# Patient Record
Sex: Male | Born: 1965 | Race: White | Hispanic: No | Marital: Married | Smoking: Never smoker
Health system: Southern US, Community
[De-identification: ages and names within clinical notes are randomized; demographics above are authoritative.]

## PROBLEM LIST (undated history)

## (undated) DIAGNOSIS — E291 Testicular hypofunction: Secondary | ICD-10-CM

## (undated) DIAGNOSIS — N181 Chronic kidney disease, stage 1: Secondary | ICD-10-CM

## (undated) DIAGNOSIS — G4730 Sleep apnea, unspecified: Secondary | ICD-10-CM

## (undated) DIAGNOSIS — N529 Male erectile dysfunction, unspecified: Secondary | ICD-10-CM

## (undated) DIAGNOSIS — T7840XA Allergy, unspecified, initial encounter: Secondary | ICD-10-CM

## (undated) DIAGNOSIS — D75839 Thrombocytosis, unspecified: Secondary | ICD-10-CM

## (undated) DIAGNOSIS — I129 Hypertensive chronic kidney disease with stage 1 through stage 4 chronic kidney disease, or unspecified chronic kidney disease: Secondary | ICD-10-CM

## (undated) DIAGNOSIS — E669 Obesity, unspecified: Secondary | ICD-10-CM

## (undated) DIAGNOSIS — I10 Essential (primary) hypertension: Secondary | ICD-10-CM

## (undated) DIAGNOSIS — D473 Essential (hemorrhagic) thrombocythemia: Secondary | ICD-10-CM

## (undated) DIAGNOSIS — E785 Hyperlipidemia, unspecified: Secondary | ICD-10-CM

## (undated) HISTORY — DX: Thrombocytosis, unspecified: D75.839

## (undated) HISTORY — DX: Hyperlipidemia, unspecified: E78.5

## (undated) HISTORY — DX: Hypertensive chronic kidney disease with stage 1 through stage 4 chronic kidney disease, or unspecified chronic kidney disease: I12.9

## (undated) HISTORY — DX: Essential (primary) hypertension: I10

## (undated) HISTORY — DX: Essential (hemorrhagic) thrombocythemia: D47.3

## (undated) HISTORY — DX: Male erectile dysfunction, unspecified: N52.9

## (undated) HISTORY — DX: Chronic kidney disease, stage 1: N18.1

## (undated) HISTORY — DX: Obesity, unspecified: E66.9

## (undated) HISTORY — DX: Allergy, unspecified, initial encounter: T78.40XA

## (undated) HISTORY — DX: Sleep apnea, unspecified: G47.30

## (undated) HISTORY — DX: Testicular hypofunction: E29.1

---

## 1988-02-21 HISTORY — PX: LUMBAR DISC SURGERY: SHX700

## 2005-02-09 ENCOUNTER — Emergency Department: Payer: Self-pay | Admitting: Emergency Medicine

## 2008-02-25 ENCOUNTER — Ambulatory Visit: Payer: Self-pay | Admitting: Family Medicine

## 2008-02-25 DIAGNOSIS — J309 Allergic rhinitis, unspecified: Secondary | ICD-10-CM | POA: Insufficient documentation

## 2008-02-25 DIAGNOSIS — I10 Essential (primary) hypertension: Secondary | ICD-10-CM | POA: Insufficient documentation

## 2008-04-28 ENCOUNTER — Ambulatory Visit: Payer: Self-pay | Admitting: Family Medicine

## 2008-04-28 DIAGNOSIS — E785 Hyperlipidemia, unspecified: Secondary | ICD-10-CM | POA: Insufficient documentation

## 2008-04-28 DIAGNOSIS — F529 Unspecified sexual dysfunction not due to a substance or known physiological condition: Secondary | ICD-10-CM | POA: Insufficient documentation

## 2008-05-06 ENCOUNTER — Ambulatory Visit: Payer: Self-pay | Admitting: Family Medicine

## 2008-05-08 LAB — CONVERTED CEMR LAB
Albumin: 4.2 g/dL (ref 3.5–5.2)
Alkaline Phosphatase: 77 units/L (ref 39–117)
Calcium: 9.4 mg/dL (ref 8.4–10.5)
Eosinophils Relative: 5.7 % — ABNORMAL HIGH (ref 0.0–5.0)
GFR calc non Af Amer: 70.65 mL/min (ref >60)
Glucose, Bld: 98 mg/dL (ref 70–99)
HCT: 45.5 % (ref 39.0–52.0)
HDL: 45.2 mg/dL (ref >39.00)
Hemoglobin: 15.7 g/dL (ref 13.0–17.0)
Lymphs Abs: 2.6 10*3/uL (ref 0.7–4.0)
MCV: 92 fL (ref 78.0–100.0)
Monocytes Absolute: 0.5 10*3/uL (ref 0.1–1.0)
Monocytes Relative: 8 % (ref 3.0–12.0)
Neutro Abs: 2.3 10*3/uL (ref 1.4–7.7)
Potassium: 4.3 meq/L (ref 3.5–5.1)
Sodium: 139 meq/L (ref 135–145)
Total Bilirubin: 0.8 mg/dL (ref 0.3–1.2)
Triglycerides: 68 mg/dL (ref 0.0–149.0)
VLDL: 13.6 mg/dL (ref 0.0–40.0)
WBC: 5.7 10*3/uL (ref 4.5–10.5)

## 2009-06-02 ENCOUNTER — Ambulatory Visit: Payer: Self-pay | Admitting: Family Medicine

## 2009-06-02 DIAGNOSIS — R5383 Other fatigue: Secondary | ICD-10-CM

## 2009-06-02 DIAGNOSIS — R5381 Other malaise: Secondary | ICD-10-CM | POA: Insufficient documentation

## 2009-06-03 LAB — CONVERTED CEMR LAB
ALT: 31 units/L (ref 0–53)
Albumin: 4.1 g/dL (ref 3.5–5.2)
BUN: 14 mg/dL (ref 6–23)
Basophils Absolute: 0 10*3/uL (ref 0.0–0.1)
CO2: 30 meq/L (ref 19–32)
Chloride: 104 meq/L (ref 96–112)
Cholesterol: 206 mg/dL — ABNORMAL HIGH (ref 0–200)
Creatinine, Ser: 1 mg/dL (ref 0.4–1.5)
Glucose, Bld: 96 mg/dL (ref 70–99)
HCT: 44 % (ref 39.0–52.0)
Hemoglobin: 15.2 g/dL (ref 13.0–17.0)
Lymphs Abs: 3.1 10*3/uL (ref 0.7–4.0)
MCHC: 34.6 g/dL (ref 30.0–36.0)
MCV: 91.9 fL (ref 78.0–100.0)
Monocytes Relative: 7.4 % (ref 3.0–12.0)
Neutro Abs: 2.6 10*3/uL (ref 1.4–7.7)
RDW: 13.6 % (ref 11.5–14.6)
TSH: 1.87 microintl units/mL (ref 0.35–5.50)
Total Protein: 7.9 g/dL (ref 6.0–8.3)
Triglycerides: 78 mg/dL (ref 0.0–149.0)

## 2009-06-07 ENCOUNTER — Ambulatory Visit: Payer: Self-pay | Admitting: Family Medicine

## 2010-03-22 NOTE — Letter (Signed)
Summary: Records Dated 10-30-03 thru 12-03-07/[facility] MD  Records Dated 10-30-03 thru 12-03-07/[facility] MD   Imported By: Lanelle Bal 11/12/2009 13:02:28  _____________________________________________________________________  External Attachment:    Type:   Image     Comment:   External Document

## 2010-03-22 NOTE — Assessment & Plan Note (Signed)
Summary: CPX/CLE   Vital Signs:  Patient profile:   45 year old male Height:      69 inches Weight:      223.2 pounds BMI:     33.08 Temp:     97.5 degrees F oral Pulse rate:   84 / minute Pulse rhythm:   regular BP sitting:   110 / 80  (left arm) Cuff size:   large  Vitals Entered By: Benny Lennert CMA Duncan Dull) (June 07, 2009 8:23 AM)  History of Present Illness: Chief complaint cpx  Preventive Screening-Counseling & Management  Alcohol-Tobacco     Alcohol Counseling: not indicated; use of alcohol is not excessive or problematic     Smoking Status: never     Tobacco Counseling: not indicated; no tobacco use  Caffeine-Diet-Exercise     Does Patient Exercise: yes     Type of exercise: running     Exercise (avg: min/session): 30-60     Times/week: 5     Exercise Counseling: not indicated; exercise is adequate  Hep-HIV-STD-Contraception     STD Risk: no risk noted     Testicular SE Education/Counseling to perform regular STE      Sexual History:  currently monogamous.        Drug Use:  never.    Clinical Review Panels:  Prevention   Last PSA:  0.54 (06/02/2009)  Lipid Management   Cholesterol:  206 (06/02/2009)   HDL (good cholesterol):  49.50 (06/02/2009)  CBC   WBC:  6.4 (06/02/2009)   RBC:  4.78 (06/02/2009)   Hgb:  15.2 (06/02/2009)   Hct:  44.0 (06/02/2009)   Platelets:  365.0 (06/02/2009)   MCV  91.9 (06/02/2009)   MCHC  34.6 (06/02/2009)   RDW  13.6 (06/02/2009)   PMN:  40.2 (06/02/2009)   Lymphs:  48.3 (06/02/2009)   Monos:  7.4 (06/02/2009)   Eosinophils:  3.6 (06/02/2009)   Basophil:  0.5 (06/02/2009)  Complete Metabolic Panel   Glucose:  96 (06/02/2009)   Sodium:  141 (06/02/2009)   Potassium:  4.3 (06/02/2009)   Chloride:  104 (06/02/2009)   CO2:  30 (06/02/2009)   BUN:  14 (06/02/2009)   Creatinine:  1.0 (06/02/2009)   Albumin:  4.1 (06/02/2009)   Total Protein:  7.9 (06/02/2009)   Calcium:  9.2 (06/02/2009)   Total Bili:  0.5  (06/02/2009)   Alk Phos:  76 (06/02/2009)   SGPT (ALT):  31 (06/02/2009)   SGOT (AST):  29 (06/02/2009)   Allergies (verified): No Known Drug Allergies  Past History:  Past medical, surgical, family and social histories (including risk factors) reviewed, and no changes noted (except as noted below).  Past Medical History: Reviewed history from 02/25/2008 and no changes required. HTN Obesity Allergies  Past Surgical History: Reviewed history from 02/25/2008 and no changes required. Lumbar surgery, L4/5, 1990   Family History: Reviewed history from 02/25/2008 and no changes required. OA, parent Ovary / Uterine: GP BRCA: GP Lung CA: GP CAD: GP DM: multiple, father, GM, brother HTN  Social History: Reviewed history from 02/25/2008 and no changes required. 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  Cardiovascular: Denies chest pains, palpitations, syncope, dyspnea on exertion,  Respiratory: Denies cough,  dyspnea at rest, excessive sputum,wheeezing GI: Denies nausea, vomiting, diarrhea, constipation, change in bowel habits, abdominal pain, melena, hematochezia GU: Denies dysuria, hematuria, discharge, urinary frequency, urinary hesitancy, nocturia, incontinence, genital sores, decreased libido Musculoskeletal: Denies back pain, joint pain Derm: Denies rash, itching Neuro: Denies  paresthesias, frequent falls, frequent headaches, and difficulty walking.  Psych: Denies depression, anxiety Endocrine: Denies cold intolerance, heat intolerance, polydipsia, polyphagia, polyuria, and unusual weight change.  Heme: Denies enlarged lymph  nodes Allergy: No hayfever   Otherwise, the pertinent positives and negatives are listed above and in the HPI, otherwise a full review of systems has been reviewed and is negative unless noted positive.    Impression & Recommendations:  Problem # 1:  HEALTH MAINTENANCE EXAM (ICD-V70.0) The patient's preventative maintenance and recommended screening tests for an annual wellness exam were reviewed in full today. Brought up to date unless services declined.  Counselled on the importance of diet, exercise, and its role in overall health and mortality. The patient's FH and SH was reviewed, including their home life, tobacco status, and drug and alcohol status.   Complete Medication List: 1)  Norvasc 5 Mg Tabs (Amlodipine besylate) .Marland Kitchen.. 1 by mouth q day  Current Allergies (reviewed today): No known allergies  Physical Exam General Appearance: well developed, well nourished, no acute distress Eyes: conjunctiva and lids normal, PERRLA, EOMI Ears, Nose, Mouth, Throat: TM clear, nares clear, oral exam WNL Neck: supple, no lymphadenopathy, no thyromegaly, no JVD Respiratory: clear to auscultation and percussion, respiratory effort normal Cardiovascular: regular rate and rhythm, S1-S2, no murmur, rub or gallop, no bruits, peripheral pulses normal and symmetric, no cyanosis, clubbing, edema or varicosities Chest: no scars, masses, tenderness; no asymmetry, skin changes, nipple discharge, no gynecomastia   Gastrointestinal: soft, non-tender; no hepatosplenomegaly, masses; active bowel sounds all quadrants, no masses, tenderness, hemorrhoids  Genitourinary: no hernia, testicular mass, penile discharge, priapism or prostate enlargement Lymphatic: no cervical, axillary or inguinal adenopathy Musculoskeletal: gait normal, muscle tone and strength WNL, no joint swelling, effusions, discoloration, crepitus  Skin: clear, good turgor, color WNL, no rashes, lesions, or ulcerations Neurologic: normal  mental status, normal reflexes, normal strength, sensation, and motion Psychiatric: alert; oriented to person, place and time Other Exam:

## 2010-05-03 ENCOUNTER — Encounter: Payer: Self-pay | Admitting: Family Medicine

## 2010-05-11 ENCOUNTER — Encounter: Payer: Self-pay | Admitting: Family Medicine

## 2010-05-11 ENCOUNTER — Ambulatory Visit (INDEPENDENT_AMBULATORY_CARE_PROVIDER_SITE_OTHER): Payer: BC Managed Care – PPO | Admitting: Family Medicine

## 2010-05-11 DIAGNOSIS — I10 Essential (primary) hypertension: Secondary | ICD-10-CM

## 2010-05-11 DIAGNOSIS — Z0000 Encounter for general adult medical examination without abnormal findings: Secondary | ICD-10-CM

## 2010-05-11 DIAGNOSIS — E785 Hyperlipidemia, unspecified: Secondary | ICD-10-CM

## 2010-05-11 DIAGNOSIS — G4730 Sleep apnea, unspecified: Secondary | ICD-10-CM

## 2010-05-11 LAB — PSA: PSA: 0.53 ng/mL (ref 0.10–4.00)

## 2010-05-11 LAB — CBC WITH DIFFERENTIAL/PLATELET
Eosinophils Relative: 3.7 % (ref 0.0–5.0)
HCT: 45.2 % (ref 39.0–52.0)
Monocytes Relative: 6.4 % (ref 3.0–12.0)
Neutrophils Relative %: 48.9 % (ref 43.0–77.0)
Platelets: 364 10*3/uL (ref 150.0–400.0)
WBC: 6.5 10*3/uL (ref 4.5–10.5)

## 2010-05-11 LAB — COMPREHENSIVE METABOLIC PANEL
ALT: 30 U/L (ref 0–53)
CO2: 28 mEq/L (ref 19–32)
Calcium: 9.4 mg/dL (ref 8.4–10.5)
Chloride: 104 mEq/L (ref 96–112)
GFR: 72.05 mL/min (ref >60.00)
Potassium: 4.3 mEq/L (ref 3.5–5.1)
Sodium: 139 mEq/L (ref 135–145)
Total Protein: 7.6 g/dL (ref 6.0–8.3)

## 2010-05-11 LAB — LIPID PANEL
Total CHOL/HDL Ratio: 5
Triglycerides: 112 mg/dL (ref 0.0–149.0)

## 2010-05-11 LAB — LDL CHOLESTEROL, DIRECT: Direct LDL: 186.8 mg/dL

## 2010-05-11 MED ORDER — AMBULATORY NON FORMULARY MEDICATION: Status: DC

## 2010-05-11 NOTE — Assessment & Plan Note (Signed)
Routinely 120/70 at home, ok to follow

## 2010-05-11 NOTE — Progress Notes (Signed)
  Subjective:    Patient ID: Eric Blevins, male    DOB: [birth year], 45 y.o.   MRN: [id number]  HPI Living at home with wife and 31 year old. Car sales and, everything related to cars.  CPX:  HTN: 120/70 at home  Lipids:  Past Medical History  Diagnosis Date  . Hypertension   . Allergy   . Obesity    Past Surgical History  Procedure Date  . Lumbar disc surgery     l4/5    The PMH, PSH, Social History, Family History, Medications, and allergies have been reviewed in [facility], and have been updated if relevant.   Review of Systems General: Denies fever, chills, sweats. No significant weight loss. Eyes: Denies blurring,significant itching ENT: Denies earache, sore throat, and hoarseness.  Cardiovascular: Denies chest pains, palpitations, dyspnea on exertion,  Respiratory: Denies cough, dyspnea at rest,wheeezing Breast: no concerns about lumps GI: Denies nausea, vomiting, diarrhea, constipation, change in bowel habits, abdominal pain, melena, hematochezia GU: Denies dysuria, hematuria, urinary hesitancy, nocturia, denies STD risk, no problems with erections Musculoskeletal: Denies back pain, joint pain Derm: Denies rash, itching Neuro: Denies  paresthesias, frequent falls, frequent headaches Psych: Denies depression, anxiety Endocrine: Denies cold intolerance, heat intolerance, polydipsia Heme: Denies enlarged lymph nodes Allergy: No hayfever     Objective:   Physical Exam    PE: GEN: well developed, well nourished, no acute distress Eyes: conjunctiva and lids normal, PERRLA, EOMI ENT: TM clear, nares clear, oral exam WNL Neck: supple, no lymphadenopathy, no thyromegaly, no JVD Pulm: clear to auscultation and percussion, respiratory effort normal CV: regular rate and rhythm, S1-S2, no murmur, rub or gallop, no bruits, peripheral pulses normal and symmetric, no cyanosis, clubbing, edema or varicosities Chest: no scars, masses, no gynecomastia   GI: soft, non-tender; no  hepatosplenomegaly, masses; active bowel sounds all quadrants GU: no hernia, testicular mass, penile discharge, priapism or prostate enlargement Lymph: no cervical, axillary or inguinal adenopathy MSK: gait normal, muscle tone and strength WNL, no joint swelling, effusions, discoloration, crepitus  SKIN: clear, good turgor, color WNL, no rashes, lesions, or ulcerations Neuro: normal mental status, normal strength, sensation, and motion Psych: alert; oriented to person, place and time, normally interactive and not anxious or depressed in appearance.     Assessment & Plan:

## 2010-05-11 NOTE — Assessment & Plan Note (Signed)
The patient's preventative maintenance and recommended screening tests for an annual wellness exam were reviewed in full today. Brought up to date unless services declined.  Counselled on the importance of diet, exercise, and its role in overall health and mortality. The patient's FH and SH was reviewed, including their home life, tobacco status, and drug and alcohol status.    

## 2010-05-11 NOTE — Assessment & Plan Note (Signed)
Check FLP 

## 2010-05-11 NOTE — Assessment & Plan Note (Signed)
Given new script for CPAP mask and tubing

## 2010-05-11 NOTE — Patient Instructions (Signed)
CPAP machine script written  Work on your exercise -- recheck in 1 year

## 2010-05-12 NOTE — Progress Notes (Signed)
Patient advised and he said that he has these appt already set up

## 2011-05-11 ENCOUNTER — Other Ambulatory Visit: Payer: Self-pay | Admitting: Family Medicine

## 2011-05-11 DIAGNOSIS — Z125 Encounter for screening for malignant neoplasm of prostate: Secondary | ICD-10-CM

## 2011-05-11 DIAGNOSIS — Z79899 Other long term (current) drug therapy: Secondary | ICD-10-CM

## 2011-05-11 DIAGNOSIS — E785 Hyperlipidemia, unspecified: Secondary | ICD-10-CM

## 2011-05-15 ENCOUNTER — Other Ambulatory Visit: Payer: BC Managed Care – PPO

## 2011-05-22 ENCOUNTER — Encounter: Payer: BC Managed Care – PPO | Admitting: Family Medicine

## 2011-05-23 ENCOUNTER — Encounter: Payer: BC Managed Care – PPO | Admitting: Family Medicine

## 2011-05-24 ENCOUNTER — Encounter: Payer: BC Managed Care – PPO | Admitting: Family Medicine

## 2011-09-21 ENCOUNTER — Emergency Department: Payer: Self-pay | Admitting: Emergency Medicine

## 2011-09-21 LAB — BASIC METABOLIC PANEL
Anion Gap: 12 (ref 7–16)
Calcium, Total: 8.6 mg/dL (ref 8.5–10.1)
EGFR (African American): >60
EGFR (Non-African Amer.): >60
Glucose: 159 mg/dL — ABNORMAL HIGH (ref 65–99)
Osmolality: 290 (ref 275–301)
Sodium: 144 mmol/L (ref 136–145)

## 2011-09-21 LAB — URINALYSIS, COMPLETE
Blood: NEGATIVE
Leukocyte Esterase: NEGATIVE
Ph: 5 (ref 4.5–8.0)
Squamous Epithelial: NONE SEEN

## 2011-09-21 LAB — CBC
HGB: 13.9 g/dL (ref 13.0–18.0)
MCH: 30.5 pg (ref 26.0–34.0)
MCHC: 33.2 g/dL (ref 32.0–36.0)
MCV: 92 fL (ref 80–100)
Platelet: 386 10*3/uL (ref 150–440)
RBC: 4.57 10*6/uL (ref 4.40–5.90)

## 2013-08-22 IMAGING — CT CT STONE STUDY
1 of 2 series · 15 of 32 positions shown, 20 images · non-contrast
Comparison: none

REASON FOR EXAM: left flank pain
COMMENTS:

PROCEDURE:     CT  - CT ABDOMEN /PELVIS WO (STONE)  - September 21, 2011  [DATE]
RESULT:     History: Left flank pain.
Comparison Study: No recent.

[Series 2: 3mm soft tissue · axial · 0.82mm/px · z∈[-975,-489]mm · 15 of 178 slices shown, 20 images]
[im 8/178  soft-tissue]
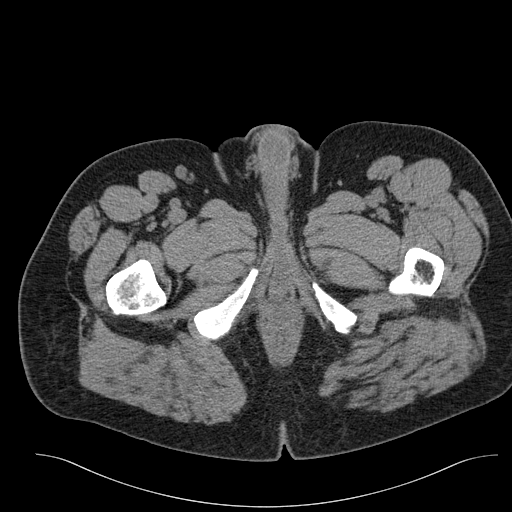
[im 8/178  bone]
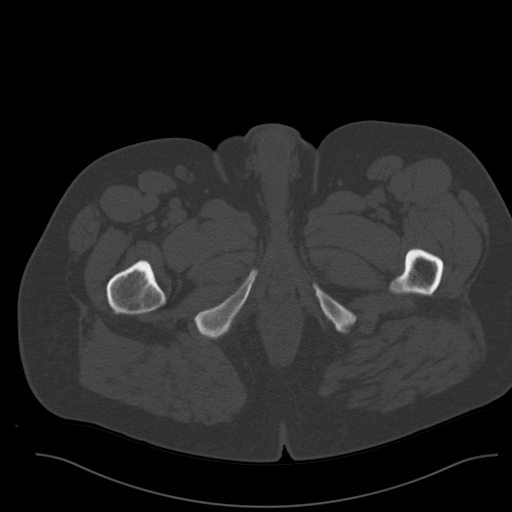
[im 24/178  soft-tissue]
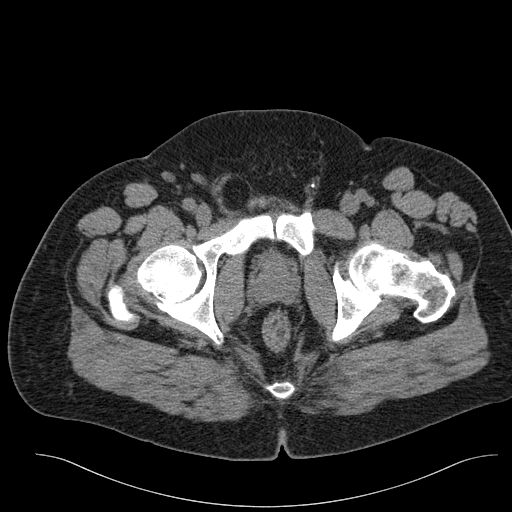
[im 31/178  soft-tissue]
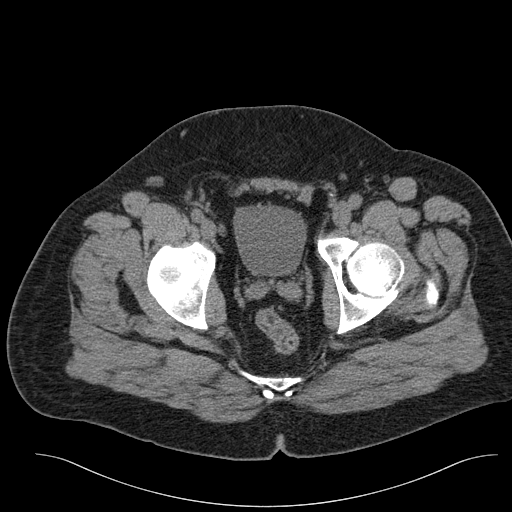
[im 47/178  soft-tissue]
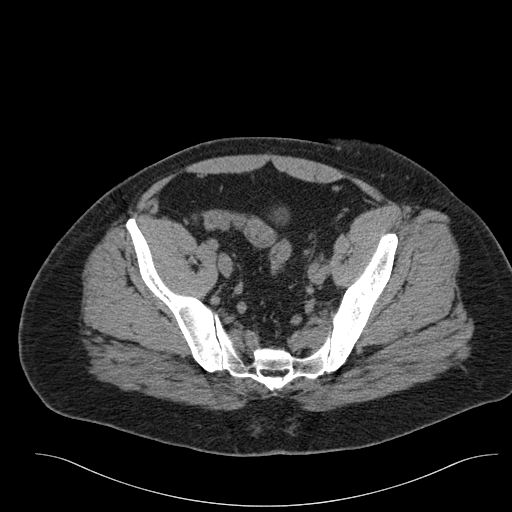
[im 62/178  soft-tissue]
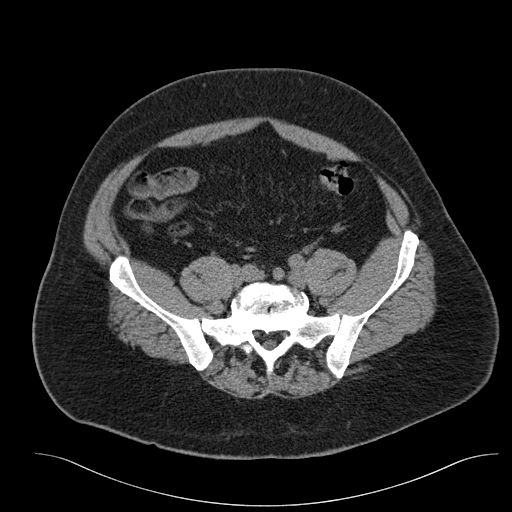
[im 70/178  soft-tissue]
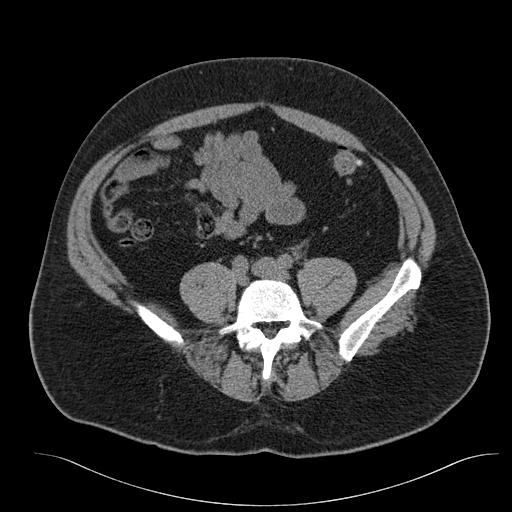
[im 85/178  soft-tissue]
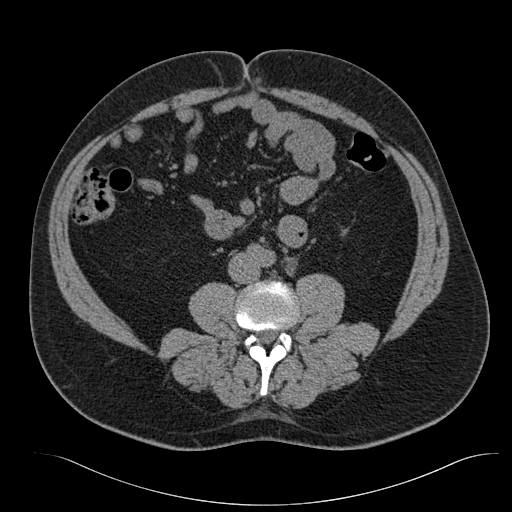
[im 93/178  soft-tissue]
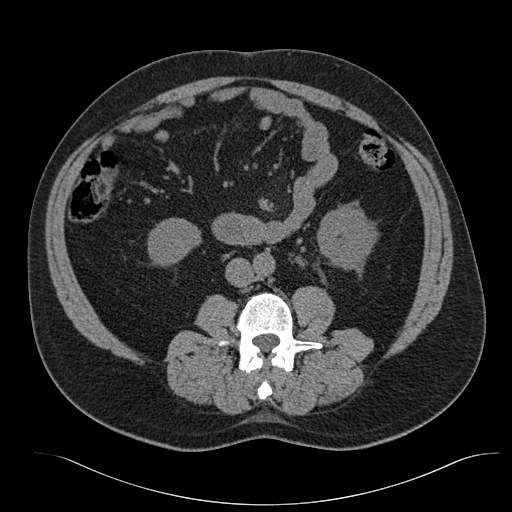
[im 108/178  soft-tissue]
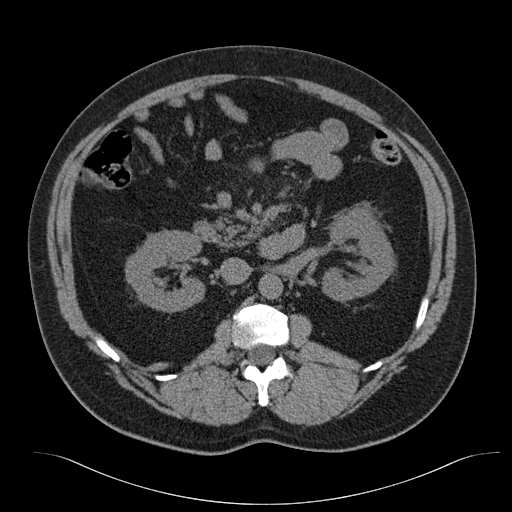
[im 108/178  bone]
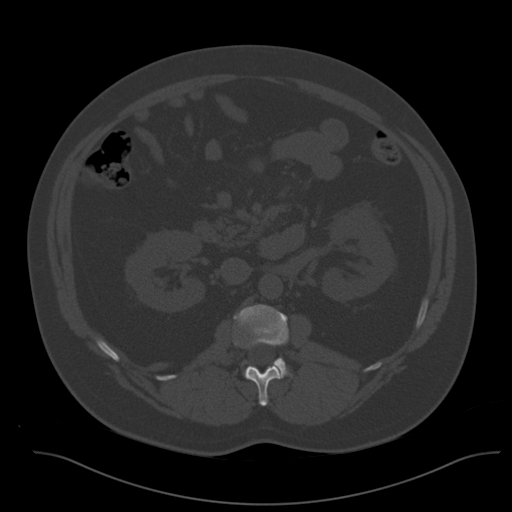
[im 116/178  soft-tissue]
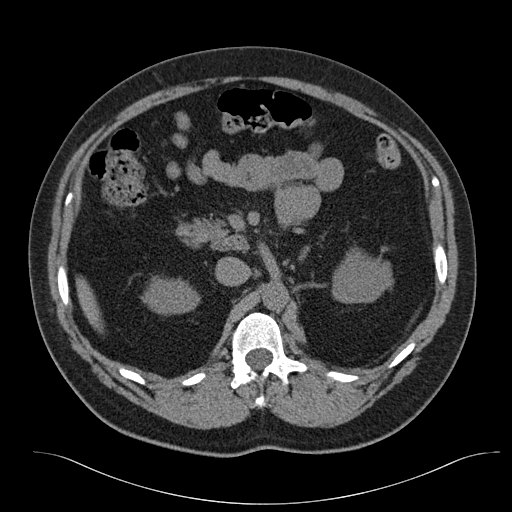
[im 131/178  soft-tissue]
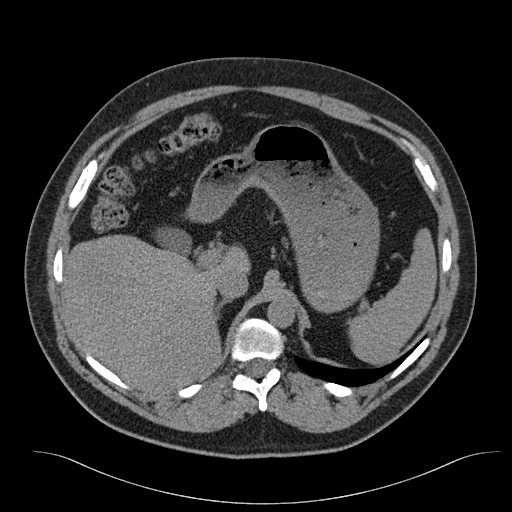
[im 147/178  soft-tissue]
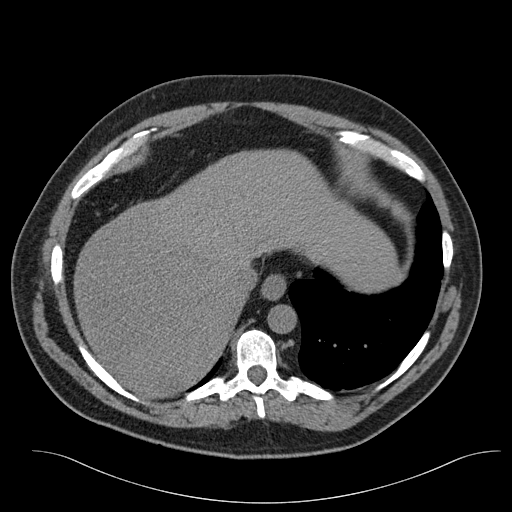
[im 147/178  lung]
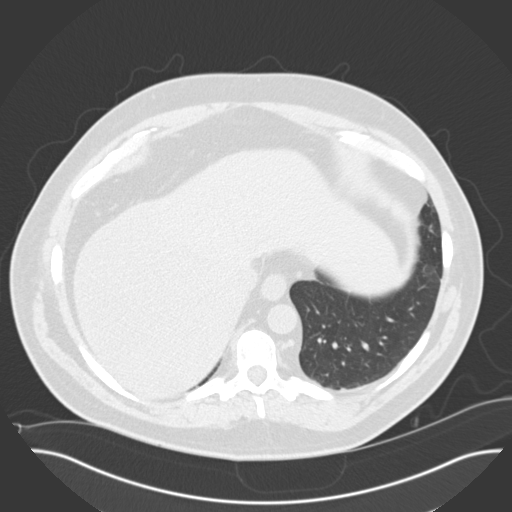
[im 154/178  soft-tissue]
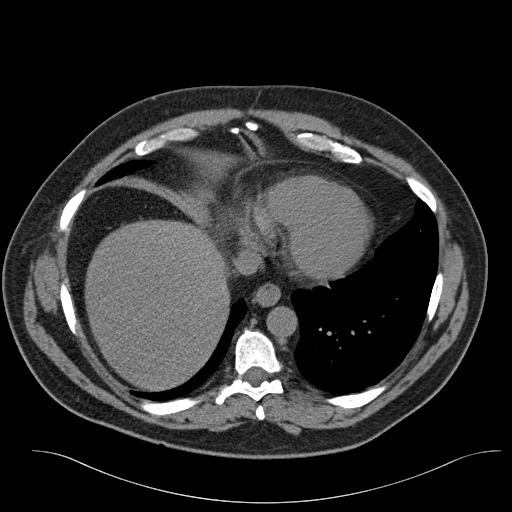
[im 154/178  lung]
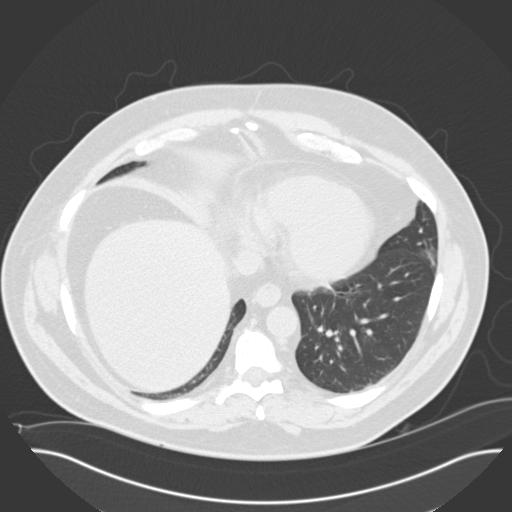
[im 162/178  lung]
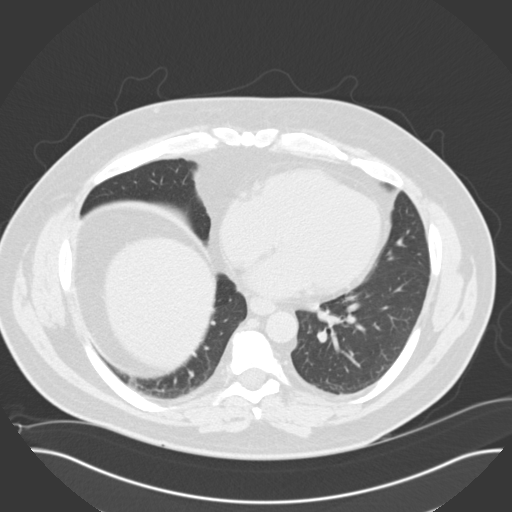
[im 170/178  soft-tissue]
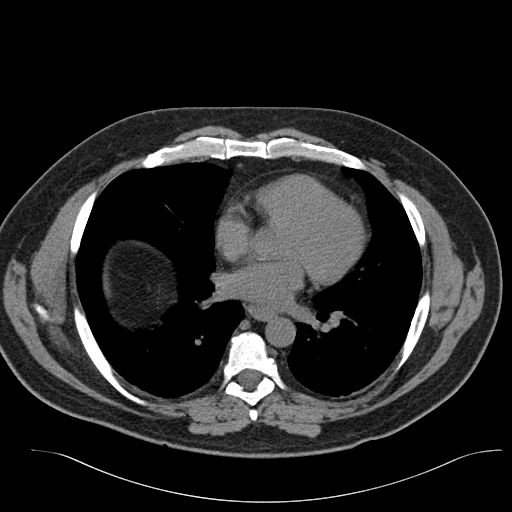
[im 170/178  lung]
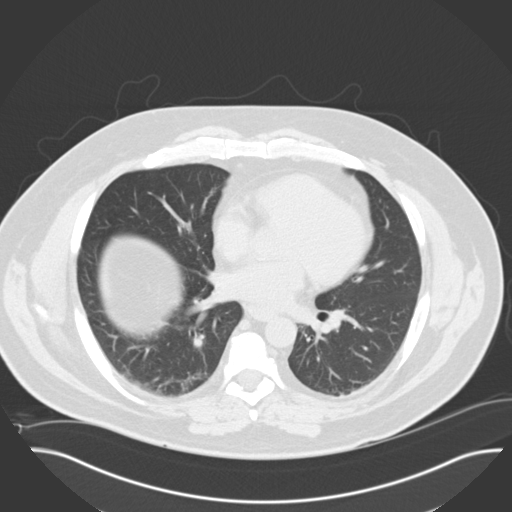

[15 of 32 positions shown; findings below may reference images not displayed]

FINDINGS: Standard nonenhanced CT obtained. Atelectasis lung bases. No free
air. Liver gallbladder spleen normal. Pancreas normal. Adrenals normal.
Aorta nondistended. Right kidney and renal collecting system unremarkable.
Mild left hydronephrosis with periureteral and perirenal streaking. 3 mm
stone distal left ureter at the ureterovesical junction. Bladder
nondistended. Phleboliths. No bowel distention.
IMPRESSION: Left ureterovesical junction 3 mm ureteral stone.

## 2014-06-16 DIAGNOSIS — E291 Testicular hypofunction: Secondary | ICD-10-CM | POA: Insufficient documentation

## 2014-06-16 DIAGNOSIS — N529 Male erectile dysfunction, unspecified: Secondary | ICD-10-CM | POA: Insufficient documentation

## 2014-06-16 DIAGNOSIS — E785 Hyperlipidemia, unspecified: Secondary | ICD-10-CM | POA: Insufficient documentation

## 2014-06-16 DIAGNOSIS — N181 Chronic kidney disease, stage 1: Secondary | ICD-10-CM | POA: Insufficient documentation

## 2014-06-16 DIAGNOSIS — I10 Essential (primary) hypertension: Secondary | ICD-10-CM | POA: Insufficient documentation

## 2014-06-16 DIAGNOSIS — I129 Hypertensive chronic kidney disease with stage 1 through stage 4 chronic kidney disease, or unspecified chronic kidney disease: Secondary | ICD-10-CM | POA: Insufficient documentation

## 2014-12-14 ENCOUNTER — Telehealth: Payer: Self-pay | Admitting: Family Medicine

## 2014-12-14 ENCOUNTER — Other Ambulatory Visit: Payer: Self-pay | Admitting: Family Medicine

## 2014-12-14 NOTE — Telephone Encounter (Signed)
Appointment scheduled for 12/21/14

## 2014-12-14 NOTE — Telephone Encounter (Signed)
Needs an appointment. Will get him enough medicine to make it to appointment when it's booked.   

## 2014-12-14 NOTE — Telephone Encounter (Signed)
This is pt Eric Blevins. Thanks.

## 2014-12-21 ENCOUNTER — Encounter: Payer: Self-pay | Admitting: Family Medicine

## 2014-12-21 ENCOUNTER — Other Ambulatory Visit: Payer: Self-pay | Admitting: Family Medicine

## 2014-12-21 ENCOUNTER — Ambulatory Visit (INDEPENDENT_AMBULATORY_CARE_PROVIDER_SITE_OTHER): Payer: BLUE CROSS/BLUE SHIELD | Admitting: Family Medicine

## 2014-12-21 VITALS — BP 134/87 | HR 77 | Temp 97.8°F | Ht 67.6 in | Wt 260.0 lb

## 2014-12-21 DIAGNOSIS — E669 Obesity, unspecified: Secondary | ICD-10-CM

## 2014-12-21 DIAGNOSIS — E291 Testicular hypofunction: Secondary | ICD-10-CM | POA: Diagnosis not present

## 2014-12-21 DIAGNOSIS — I129 Hypertensive chronic kidney disease with stage 1 through stage 4 chronic kidney disease, or unspecified chronic kidney disease: Secondary | ICD-10-CM | POA: Diagnosis not present

## 2014-12-21 DIAGNOSIS — E785 Hyperlipidemia, unspecified: Secondary | ICD-10-CM

## 2014-12-21 DIAGNOSIS — Z23 Encounter for immunization: Secondary | ICD-10-CM

## 2014-12-21 LAB — LIPID PANEL PICCOLO, WAIVED
CHOL/HDL RATIO PICCOLO,WAIVE: 3.6 mg/dL
Cholesterol Piccolo, Waived: 220 mg/dL — ABNORMAL HIGH (ref <200)
HDL Chol Piccolo, Waived: 61 mg/dL (ref >59)
LDL Chol Calc Piccolo Waived: 142 mg/dL — ABNORMAL HIGH (ref <100)
TRIGLYCERIDES PICCOLO,WAIVED: 85 mg/dL (ref <150)
VLDL CHOL CALC PICCOLO,WAIVE: 17 mg/dL (ref <30)

## 2014-12-21 LAB — MICROALBUMIN, URINE WAIVED
Creatinine, Urine Waived: 100 mg/dL (ref 10–300)
Microalb, Ur Waived: 10 mg/L (ref 0–19)
Microalb/Creat Ratio: <30 mg/g (ref <30)

## 2014-12-21 MED ORDER — LISINOPRIL 2.5 MG PO TABS: 10.0000 mg | ORAL_TABLET | Freq: Every day | ORAL | Status: DC

## 2014-12-21 MED ORDER — AMLODIPINE BESYLATE 5 MG PO TABS: 5.0000 mg | ORAL_TABLET | Freq: Every day | ORAL | Status: DC

## 2014-12-21 NOTE — Assessment & Plan Note (Signed)
Still borderline. May need medication. Will work on diet and will recheck in 6 months.

## 2014-12-21 NOTE — Addendum Note (Signed)
Addended by: Gerrit Halls L on: 12/21/2014 09:17 AM   Modules accepted: Orders

## 2014-12-21 NOTE — Assessment & Plan Note (Signed)
Rechecking levels today. Await results and treat as needed.  

## 2014-12-21 NOTE — Assessment & Plan Note (Signed)
Has tried diet, exercise, phentermine with poor results. Interested in weight loss management. Will check on saxenda and contrave and let us know which one he would like to try. Education given today.

## 2014-12-21 NOTE — Patient Instructions (Signed)

## 2014-12-21 NOTE — Progress Notes (Signed)
BP 134/87 mmHg  Pulse 77  Temp(Src) 97.8 F (36.6 C)  Ht 5' 7.6" (1.717 m)  Wt 260 lb (117.935 kg)  BMI 40.00 kg/m2  SpO2 96%   Subjective:    Patient ID: Eric Blevins, male    DOB: 16-Feb-1966, 49 y.o.   MRN: [id number]  HPI: Eric Blevins is a 49 y.o. male  Chief Complaint  Patient presents with  . Hypertension  . Hypogonadism   HYPERTENSION / HYPERLIPIDEMIA Satisfied with current treatment? yes Duration of hypertension: years BP monitoring frequency: a few times a month BP medication side effects: no Duration of hyperlipidemia: years Cholesterol medication side effects: no- not on meds Aspirin: no Recent stressors: no Recurrent headaches: no Visual changes: no Palpitations: no Dyspnea: no Chest pain: no Lower extremity edema: no Dizzy/lightheaded: no  ED cleared up when he stopped taking the phentermine. Didn't like how it made him feel  Relevant past medical, surgical, family and social history reviewed and updated as indicated. Interim medical history since our last visit reviewed. Allergies and medications reviewed and updated.  Review of Systems  Constitutional: Negative.   Respiratory: Negative.   Cardiovascular: Negative.   Psychiatric/Behavioral: Negative.     Per HPI unless specifically indicated above     Objective:    BP 134/87 mmHg  Pulse 77  Temp(Src) 97.8 F (36.6 C)  Ht 5' 7.6" (1.717 m)  Wt 260 lb (117.935 kg)  BMI 40.00 kg/m2  SpO2 96%  Wt Readings from Last 3 Encounters:  12/21/14 260 lb (117.935 kg)  06/16/14 247 lb (112.038 kg)  05/11/10 232 lb 12.8 oz (105.597 kg)    Physical Exam  Constitutional: He is oriented to person, place, and time. He appears well-developed and well-nourished. No distress.  HENT:  Head: Normocephalic and atraumatic.  Right Ear: Hearing normal.  Left Ear: Hearing normal.  Nose: Nose normal.  Eyes: Conjunctivae and lids are normal. Right eye exhibits no discharge. Left eye exhibits no  discharge. No scleral icterus.  Pulmonary/Chest: Effort normal. No respiratory distress.  Musculoskeletal: Normal range of motion.  Neurological: He is alert and oriented to person, place, and time.  Skin: Skin is intact. No rash noted.  Psychiatric: He has a normal mood and affect. His speech is normal and behavior is normal. Judgment and thought content normal. Cognition and memory are normal.      Assessment & Plan:   Problem List Items Addressed This Visit      Endocrine   Hypogonadism in male    Rechecking levels today. Await results and treat as needed.         Genitourinary   Benign hypertensive renal disease    Was on phentermine when needed meds increased. Doing well just on amlopdine, but needs ACE for renal protection. Lisinopril decreased to 2.5mg  and will check in in 1 month. Continue to monitor.         Other   OBESITY, UNSPECIFIED - Primary    Has tried diet, exercise, phentermine with poor results. Interested in weight loss management. Will check on saxenda and contrave and let us know which one he would like to try. Education given today.      Hyperlipidemia   Relevant Medications   amLODipine (NORVASC) 5 MG tablet   lisinopril (PRINIVIL,ZESTRIL) 2.5 MG tablet       Follow up plan: Return in about 4 weeks (around 01/18/2015) for BP check.

## 2014-12-21 NOTE — Assessment & Plan Note (Signed)
Was on phentermine when needed meds increased. Doing well just on amlopdine, but needs ACE for renal protection. Lisinopril decreased to 2.5mg  and will check in in 1 month. Continue to monitor.

## 2014-12-22 ENCOUNTER — Telehealth: Payer: Self-pay | Admitting: Family Medicine

## 2014-12-22 DIAGNOSIS — E291 Testicular hypofunction: Secondary | ICD-10-CM

## 2014-12-22 DIAGNOSIS — E669 Obesity, unspecified: Secondary | ICD-10-CM

## 2014-12-22 LAB — COMPREHENSIVE METABOLIC PANEL
ALT: 38 IU/L (ref 0–44)
AST: 30 IU/L (ref 0–40)
Albumin/Globulin Ratio: 1.3 (ref 1.1–2.5)
Albumin: 4.2 g/dL (ref 3.5–5.5)
Alkaline Phosphatase: 89 IU/L (ref 39–117)
BILIRUBIN TOTAL: 0.2 mg/dL (ref 0.0–1.2)
BUN/Creatinine Ratio: 10 (ref 9–20)
BUN: 10 mg/dL (ref 6–24)
CALCIUM: 9.7 mg/dL (ref 8.7–10.2)
CO2: 23 mmol/L (ref 18–29)
Chloride: 101 mmol/L (ref 97–106)
Creatinine, Ser: 1.04 mg/dL (ref 0.76–1.27)
GFR calc non Af Amer: 85 mL/min/{1.73_m2} (ref >59)
GFR, EST AFRICAN AMERICAN: 98 mL/min/{1.73_m2} (ref >59)
GLUCOSE: 89 mg/dL (ref 65–99)
Globulin, Total: 3.2 g/dL (ref 1.5–4.5)
Potassium: 4.8 mmol/L (ref 3.5–5.2)
Sodium: 139 mmol/L (ref 136–144)
TOTAL PROTEIN: 7.4 g/dL (ref 6.0–8.5)

## 2014-12-22 LAB — TESTOSTERONE, FREE, TOTAL, SHBG: Testosterone, Free: 11.2 pg/mL (ref 6.8–21.5)

## 2014-12-22 MED ORDER — TESTOSTERONE 50 MG/5GM (1%) TD GEL: 5.0000 g | Freq: Every day | TRANSDERMAL | Status: DC

## 2014-12-22 MED ORDER — LIRAGLUTIDE -WEIGHT MANAGEMENT 18 MG/3ML ~~LOC~~ SOPN: 0.6000 mg | PEN_INJECTOR | Freq: Every day | SUBCUTANEOUS | Status: DC

## 2014-12-22 NOTE — Telephone Encounter (Signed)
Called Eric Blevins. Results normal. Very low testosterone. Will restart androgel. Rx printed for him to pick up. Wants to try saxenda. Rx sent to his pharmacy. Follow up 1 month for recheck testosterone and tolerance of saxenda

## 2015-01-18 ENCOUNTER — Ambulatory Visit (INDEPENDENT_AMBULATORY_CARE_PROVIDER_SITE_OTHER): Payer: BLUE CROSS/BLUE SHIELD | Admitting: Family Medicine

## 2015-01-18 ENCOUNTER — Encounter: Payer: Self-pay | Admitting: Family Medicine

## 2015-01-18 VITALS — BP 125/86 | HR 73 | Temp 98.6°F | Ht 67.6 in | Wt 266.0 lb

## 2015-01-18 DIAGNOSIS — I129 Hypertensive chronic kidney disease with stage 1 through stage 4 chronic kidney disease, or unspecified chronic kidney disease: Secondary | ICD-10-CM

## 2015-01-18 DIAGNOSIS — E291 Testicular hypofunction: Secondary | ICD-10-CM | POA: Diagnosis not present

## 2015-01-18 NOTE — Assessment & Plan Note (Signed)
Rechecking testosterone today after 1 month on the androgel. Continue to monitor. Recheck 6 months.

## 2015-01-18 NOTE — Assessment & Plan Note (Signed)
Under great control. Continue current regimen. Continue to monitor. Recheck 6 months.

## 2015-01-18 NOTE — Progress Notes (Signed)
BP 125/86 mmHg  Pulse 73  Temp(Src) 98.6 F (37 C)  Ht 5' 7.6" (1.717 m)  Wt 266 lb (120.657 kg)  BMI 40.93 kg/m2  SpO2 98%   Subjective:    Patient ID: Eric Blevins, male    DOB: [birth year], 49 y.o.   MRN: [id number]  HPI: Eric Blevins is a 49 y.o. male  Chief Complaint  Patient presents with  . Hypertension    Patient states that he is only taking 1 of the lisinopril daily.   HYPERTENSION Hypertension status: controlled  Satisfied with current treatment? yes Duration of hypertension: chronic BP monitoring frequency:  rarely BP medication side effects:  no Medication compliance: excellent compliance Aspirin: no Recurrent headaches: no Visual changes: no Palpitations: no Dyspnea: no Chest pain: no Lower extremity edema: no Dizzy/lightheaded: no  Doing well on the testosterone. No side effects that he has noticed. Feeling fine. Not noticing much of a difference yet. No concerns or complaints with it.   Relevant past medical, surgical, family and social history reviewed and updated as indicated. Interim medical history since our last visit reviewed. Allergies and medications reviewed and updated.  Review of Systems  Constitutional: Negative.   Respiratory: Negative.   Cardiovascular: Negative.   Gastrointestinal: Negative.   Psychiatric/Behavioral: Negative.     Per HPI unless specifically indicated above     Objective:    BP 125/86 mmHg  Pulse 73  Temp(Src) 98.6 F (37 C)  Ht 5' 7.6" (1.717 m)  Wt 266 lb (120.657 kg)  BMI 40.93 kg/m2  SpO2 98%  Wt Readings from Last 3 Encounters:  01/18/15 266 lb (120.657 kg)  12/21/14 260 lb (117.935 kg)  06/16/14 247 lb (112.038 kg)    Physical Exam  Constitutional: He is oriented to person, place, and time. He appears well-developed and well-nourished. No distress.  HENT:  Head: Normocephalic and atraumatic.  Right Ear: Hearing normal.  Left Ear: Hearing normal.  Nose: Nose normal.  Eyes:  Conjunctivae and lids are normal. Right eye exhibits no discharge. Left eye exhibits no discharge. No scleral icterus.  Cardiovascular: Normal rate, regular rhythm, normal heart sounds and intact distal pulses.  Exam reveals no gallop and no friction rub.   No murmur heard. Pulmonary/Chest: Effort normal and breath sounds normal. No respiratory distress. He has no wheezes. He has no rales. He exhibits no tenderness.  Musculoskeletal: Normal range of motion.  Neurological: He is alert and oriented to person, place, and time.  Skin: Skin is warm, dry and intact. No rash noted. No erythema. No pallor.  Psychiatric: He has a normal mood and affect. His speech is normal and behavior is normal. Judgment and thought content normal. Cognition and memory are normal.  Nursing note and vitals reviewed.   Results for orders placed or performed in visit on 12/21/14  Comprehensive metabolic panel  Result Value Ref Range   Glucose 89 65 - 99 mg/dL   BUN 10 6 - 24 mg/dL   Creatinine, Ser 1.04 0.76 - 1.27 mg/dL   GFR calc non Af Amer 85 >59 mL/min/1.73   GFR calc Af Amer 98 >59 mL/min/1.73   BUN/Creatinine Ratio 10 9 - 20   Sodium 139 136 - 144 mmol/L   Potassium 4.8 3.5 - 5.2 mmol/L   Chloride 101 97 - 106 mmol/L   CO2 23 18 - 29 mmol/L   Calcium 9.7 8.7 - 10.2 mg/dL   Total Protein 7.4 6.0 - 8.5 g/dL   Albumin 4.2  3.5 - 5.5 g/dL   Globulin, Total 3.2 1.5 - 4.5 g/dL   Albumin/Globulin Ratio 1.3 1.1 - 2.5   Bilirubin Total 0.2 0.0 - 1.2 mg/dL   Alkaline Phosphatase 89 39 - 117 IU/L   AST 30 0 - 40 IU/L   ALT 38 0 - 44 IU/L  Lipid Panel Piccolo, Waived  Result Value Ref Range   Cholesterol Piccolo, Waived 220 (H) <200 mg/dL   HDL Chol Piccolo, Waived 61 >59 mg/dL   Triglycerides Piccolo,Waived 85 <150 mg/dL   Chol/HDL Ratio Piccolo,Waive 3.6 mg/dL   LDL Chol Calc Piccolo Waived 142 (H) <100 mg/dL   VLDL Chol Calc Piccolo,Waive 17 <30 mg/dL  Microalbumin, Urine Waived  Result Value Ref Range    Microalb, Ur Waived 10 0 - 19 mg/L   Creatinine, Urine Waived 100 10 - 300 mg/dL   Microalb/Creat Ratio <30 <30 mg/g  Testosterone, free, total  Result Value Ref Range   Testosterone, Free 11.2 6.8 - 21.5 pg/mL      Assessment & Plan:   Problem List Items Addressed This Visit      Endocrine   Hypogonadism in male - Primary    Rechecking testosterone today after 1 month on the androgel. Continue to monitor. Recheck 6 months.         Genitourinary   Benign hypertensive renal disease    Under great control. Continue current regimen. Continue to monitor. Recheck 6 months.       Relevant Orders   Basic metabolic panel       Follow up plan: Return in about 6 months (around 07/18/2015) for Physical.

## 2015-01-18 NOTE — Patient Instructions (Signed)
Testosterone skin gel What is this medicine? TESTOSTERONE (tes TOS ter one) is the main male hormone. It supports normal male traits such as muscle growth, facial hair, and deep voice. This gel is used in males to treat low testosterone levels. This medicine may be used for other purposes; ask your health care provider or pharmacist if you have questions. What should I tell my health care provider before I take this medicine? They need to know if you have any of these conditions: -breast cancer -diabetes -heart disease -if a male partner is pregnant or trying to get pregnant -kidney disease -liver disease -lung disease -prostate cancer, enlargement -an unusual or allergic reaction to testosterone, soy proteins, other medicines, foods, dyes, or preservatives -pregnant or trying to get pregnant -breast-feeding How should I use this medicine? This medicine is for external use only. This medicine is applied at the same time every day (preferably in the morning) to clean, dry, intact skin. If you take a bath or shower in the morning, apply the gel after the bath or shower. Follow the directions on the prescription label. Make sure that you are using your testosterone gel product correctly and applying it only to the appropriate skin area (see below). Allow the skin to dry a few minutes then cover with clothing to prevent others from coming in contact with the medicine on your skin. The gel is flammable. Avoid fire, flame, or smoking until the gel has dried. Wash your hands with soap and water after use. For AndroGel 1% Packets: Open the packet(s) needed for your dose. You can put the entire dose into your palm all at once or just a little at a time to apply. If you prefer, you can instead squeeze the gel directly onto the area you are applying it to. Apply on the shoulders, upper arm, or abdomen as directed. Do not apply to the scrotum or genitals. Be sure you use the correct total dose. It is best  to wait 5 to 6 hours after application of the gel before showering or swimming. For AndroGel 1%: Pump the dose into the palm of your hand. You can put the entire dose into your palm all at once or just a little at a time to apply. If you prefer, you can instead pump the gel directly onto the area you are applying it to. Apply on the shoulders, upper arm, or abdomen as directed. Do not apply to the scrotum or genitals. Be sure you use the correct total dose. It is best to wait for 5 to 6 hours after application of the gel before showering or swimming. For Androgel 1.62% packets: Open the packet(s) needed for your dose. You can put the entire dose into your palm all at once or just a little at a time to apply. If you prefer, you can instead squeeze the gel directly onto the area you are applying it to. Apply on the shoulders and upper arms as directed. Do not apply to other parts of the body including the abdomen, genitals, chest, armpits, or knees. Be sure you use the correct total dose. It is best to wait 2 hours after application of the gel before washing, showering, or swimming. For AndroGel 1.62%: Pump the dose into the palm of your hand. Dispense one pump of gel at a time into the palm of your hand before applying it. If you prefer, you can instead pump the gel directly onto the area you are applying it to. Apply on  the shoulders and upper arms as directed. Do not apply to other parts of the body including the abdomen, genitals, chest, armpits, or knees. Be sure you use the correct total dose. It is best to wait 2 hours after application of the gel before washing, showering, or swimming. For Testim: Open the tube(s) needed for your dose. Squeeze the gel from the tube into the palm of your hand. Apply on the shoulders or upper arms as directed. Do not apply to the scrotum, genitals, or abdomen. Be sure you use the correct total dose. Do not shower or swim for at least 2 hours after application of the  gel. For Fortesta: Use the multi-dose pump to pump the gel directly onto the area you are applying it to. Apply on the thighs as directed. Do not apply to the abdomen, penis, scrotum, shoulders or upper arms. Gently rub the gel onto the skin using your finger. Be sure you use the correct total dose. Do not shower or swim for at least 2 hours after application of the gel. A special MedGuide will be given to you by the pharmacist with each prescription and refill. Be sure to read this information carefully each time. Talk to your pediatrician regarding the use of this medicine in children. Special care may be needed. Overdosage: If you think you have taken too much of this medicine contact a poison control center or emergency room at once. NOTE: This medicine is only for you. Do not share this medicine with others. What if I miss a dose? If you miss a dose, use it as soon as you can. If it is almost time for your next dose, use only that dose. Do not use double or extra doses. What may interact with this medicine? -medicines for diabetes -medicines that treat or prevent blood clots like warfarin -oxyphenbutazone -propranolol -steroid medicines like prednisone or cortisone This list may not describe all possible interactions. Give your health care provider a list of all the medicines, herbs, non-prescription drugs, or dietary supplements you use. Also tell them if you smoke, drink alcohol, or use illegal drugs. Some items may interact with your medicine. What should I watch for while using this medicine? Visit your doctor or health care professional for regular checks on your progress. They will need to check the level of testosterone in your blood. This medicine is only approved for use in men who have low levels of testosterone related to certain medical conditions. Heart attacks and strokes have been reported with the use of this medicine. Notify your doctor or health care professional and seek  emergency treatment if you develop breathing problems; changes in vision; confusion; chest pain or chest tightness; sudden arm pain; severe, sudden headache; trouble speaking or understanding; sudden numbness or weakness of the face, arm or leg; loss of balance or coordination. Talk to your doctor about the risks and benefits of this medicine. This medicine can transfer from your body to others. If a person or pet comes in contact with the area where this medicine was applied to your skin, they may have a serious risk of side effects. If you cannot avoid skin-to-skin contact with another person, make sure the site where this medicine was applied is covered with clothing. If accidental contact happens, the skin of the person or pet should be washed right away with soap and water. Also, a male partner who is pregnant or trying to get pregnant should avoid contact with the gel or treated skin.  This medicine may affect blood sugar levels. If you have diabetes, check with your doctor or health care professional before you change your diet or the dose of your diabetic medicine. This drug is banned from use in athletes by most athletic organizations. What side effects may I notice from receiving this medicine? Side effects that you should report to your doctor or health care professional as soon as possible: -allergic reactions like skin rash, itching or hives, swelling of the face, lips, or tongue -breast enlargement -breathing problems -changes in mood, especially anger, depression, or rage -dark urine -general ill feeling or flu-like symptoms -light-colored stools -loss of appetite, nausea -nausea, vomiting -right upper belly pain -stomach pain -swelling of ankles -too frequent or persistent erections -trouble passing urine or change in the amount of urine -unusually weak or tired -yellowing of the eyes or skin Side effects that usually do not require medical attention (report to your doctor or  health care professional if they continue or are bothersome): -acne -change in sex drive or performance -hair loss -headache This list may not describe all possible side effects. Call your doctor for medical advice about side effects. You may report side effects to FDA at 1-800-FDA-1088. Where should I keep my medicine? Keep out of the reach of children. This medicine can be abused. Keep your medicine in a safe place to protect it from theft. Do not share this medicine with anyone. Selling or giving away this medicine is dangerous and against the law. Store at room temperature between 15 to 30 degrees C (59 to 86 degrees F). Keep closed until use. Protect from heat and light. This medicine is flammable. Avoid exposure to heat, fire, flame, and smoking. Throw away any unused medicine after the expiration date. NOTE: This sheet is a summary. It may not cover all possible information. If you have questions about this medicine, talk to your doctor, pharmacist, or health care provider.    2016, [employer] Standard. (2013-04-24 08:27:26)

## 2015-01-19 ENCOUNTER — Telehealth: Payer: Self-pay | Admitting: Family Medicine

## 2015-01-19 ENCOUNTER — Encounter: Payer: Self-pay | Admitting: Family Medicine

## 2015-01-19 DIAGNOSIS — E291 Testicular hypofunction: Secondary | ICD-10-CM

## 2015-01-19 DIAGNOSIS — G4730 Sleep apnea, unspecified: Secondary | ICD-10-CM

## 2015-01-19 LAB — TESTOSTERONE, FREE, TOTAL, SHBG: TESTOSTERONE FREE: 10.1 pg/mL (ref 6.8–21.5)

## 2015-01-19 LAB — BASIC METABOLIC PANEL
BUN/Creatinine Ratio: 10 (ref 9–20)
BUN: 11 mg/dL (ref 6–24)
CALCIUM: 9.4 mg/dL (ref 8.7–10.2)
CHLORIDE: 99 mmol/L (ref 97–106)
CO2: 25 mmol/L (ref 18–29)
Creatinine, Ser: 1.05 mg/dL (ref 0.76–1.27)
GFR calc Af Amer: 97 mL/min/{1.73_m2} (ref >59)
GFR calc non Af Amer: 84 mL/min/{1.73_m2} (ref >59)
Glucose: 89 mg/dL (ref 65–99)
POTASSIUM: 4.9 mmol/L (ref 3.5–5.2)
Sodium: 140 mmol/L (ref 136–144)

## 2015-01-19 MED ORDER — TESTOSTERONE 50 MG/5GM (1%) TD GEL
10.0000 g | Freq: Every day | TRANSDERMAL | Status: DC
Start: 2015-01-19 — End: 2015-09-28

## 2015-01-19 NOTE — Telephone Encounter (Signed)
Called and let patient know that his testosterone is still low. Will increase his testosterone. Rx up front for him to pick up. Recheck levels in 1 month.

## 2015-02-14 ENCOUNTER — Other Ambulatory Visit: Payer: Self-pay | Admitting: Family Medicine

## 2015-02-14 NOTE — Telephone Encounter (Signed)
Last note and med list reviewed; Kirke Shaggy was discontinued at the last visit but I do not know why, so I'm not comfortable refilling it I will leave this for patient's primary who will be back on Tuesday (today is Christmas and the office is closed on Monday, so this request will be seen by primary within 48 business hours of receipt)

## 2015-04-06 ENCOUNTER — Encounter: Payer: Self-pay | Admitting: Family Medicine

## 2015-07-04 DIAGNOSIS — S8392XA Sprain of unspecified site of left knee, initial encounter: Secondary | ICD-10-CM | POA: Diagnosis not present

## 2015-07-26 ENCOUNTER — Encounter: Payer: BLUE CROSS/BLUE SHIELD | Admitting: Family Medicine

## 2015-07-30 ENCOUNTER — Other Ambulatory Visit: Payer: Self-pay | Admitting: Family Medicine

## 2015-08-11 ENCOUNTER — Other Ambulatory Visit: Payer: Self-pay | Admitting: Family Medicine

## 2015-09-16 ENCOUNTER — Encounter (INDEPENDENT_AMBULATORY_CARE_PROVIDER_SITE_OTHER): Payer: Self-pay

## 2015-09-28 ENCOUNTER — Ambulatory Visit (INDEPENDENT_AMBULATORY_CARE_PROVIDER_SITE_OTHER): Payer: BLUE CROSS/BLUE SHIELD | Admitting: Family Medicine

## 2015-09-28 ENCOUNTER — Encounter: Payer: Self-pay | Admitting: Family Medicine

## 2015-09-28 VITALS — BP 132/82 | HR 74 | Temp 98.1°F | Ht 68.3 in | Wt 254.0 lb

## 2015-09-28 DIAGNOSIS — M5431 Sciatica, right side: Secondary | ICD-10-CM | POA: Diagnosis not present

## 2015-09-28 DIAGNOSIS — E785 Hyperlipidemia, unspecified: Secondary | ICD-10-CM

## 2015-09-28 DIAGNOSIS — I129 Hypertensive chronic kidney disease with stage 1 through stage 4 chronic kidney disease, or unspecified chronic kidney disease: Secondary | ICD-10-CM

## 2015-09-28 DIAGNOSIS — L03811 Cellulitis of head [any part, except face]: Secondary | ICD-10-CM | POA: Diagnosis not present

## 2015-09-28 DIAGNOSIS — E291 Testicular hypofunction: Secondary | ICD-10-CM

## 2015-09-28 DIAGNOSIS — Z0000 Encounter for general adult medical examination without abnormal findings: Secondary | ICD-10-CM | POA: Diagnosis not present

## 2015-09-28 LAB — UA/M W/RFLX CULTURE, ROUTINE
Bilirubin, UA: NEGATIVE
Glucose, UA: NEGATIVE
KETONES UA: NEGATIVE
LEUKOCYTES UA: NEGATIVE
NITRITE UA: NEGATIVE
PH UA: 5.5 (ref 5.0–7.5)
Protein, UA: NEGATIVE
RBC, UA: NEGATIVE
Specific Gravity, UA: 1.015 (ref 1.005–1.030)
Urobilinogen, Ur: 0.2 mg/dL (ref 0.2–1.0)

## 2015-09-28 LAB — MICROALBUMIN, URINE WAIVED
CREATININE, URINE WAIVED: 100 mg/dL (ref 10–300)
Microalb, Ur Waived: 30 mg/L — ABNORMAL HIGH (ref 0–19)

## 2015-09-28 LAB — BAYER DCA HB A1C WAIVED: HB A1C: 5.8 % (ref <7.0)

## 2015-09-28 LAB — LIPID PANEL PICCOLO, WAIVED
CHOLESTEROL PICCOLO, WAIVED: 221 mg/dL — AB (ref <200)
Chol/HDL Ratio Piccolo,Waive: 4.4 mg/dL
HDL CHOL PICCOLO, WAIVED: 50 mg/dL — AB (ref >59)
LDL CHOL CALC PICCOLO WAIVED: 146 mg/dL — AB (ref <100)
Triglycerides Piccolo,Waived: 123 mg/dL (ref <150)
VLDL Chol Calc Piccolo,Waive: 25 mg/dL (ref <30)

## 2015-09-28 MED ORDER — NAPROXEN 500 MG PO TABS: 500.0000 mg | ORAL_TABLET | Freq: Two times a day (BID) | ORAL | 3 refills | Status: DC

## 2015-09-28 MED ORDER — SULFAMETHOXAZOLE-TRIMETHOPRIM 400-80 MG PO TABS: 1.0000 | ORAL_TABLET | Freq: Two times a day (BID) | ORAL | 0 refills | Status: DC

## 2015-09-28 MED ORDER — LISINOPRIL 2.5 MG PO TABS: ORAL_TABLET | ORAL | 1 refills | Status: DC

## 2015-09-28 MED ORDER — AMLODIPINE BESYLATE 5 MG PO TABS: ORAL_TABLET | ORAL | 1 refills | Status: DC

## 2015-09-28 MED ORDER — CYCLOBENZAPRINE HCL 10 MG PO TABS: 10.0000 mg | ORAL_TABLET | Freq: Every day | ORAL | 0 refills | Status: DC

## 2015-09-28 NOTE — Assessment & Plan Note (Signed)
Borderline. Continue current regimen. Continue to monitor.

## 2015-09-28 NOTE — Progress Notes (Signed)
BP 132/82   Pulse 74   Temp 98.1 F (36.7 C)   Ht 5' 8.3" (1.735 m)   Wt 254 lb (115.2 kg)   SpO2 98%   BMI 38.28 kg/m    Subjective:    Patient ID: Eric Blevins, male    DOB: 07-08-1965, 50 y.o.   MRN: [id number]  HPI: Eric Blevins is a 50 y.o. male presenting on 09/28/2015 for comprehensive medical examination. Current medical complaints include:  Has been having some pain in his back. Normal from first thing in the morning until 1-2PM, then starts with pain that goes down his R leg. Lifting during that time period. He is getting down under a car. Fine on the weekends.  LUMP Duration: 1 week Location: back of his neck Onset: sudden Painful: yes Discomfort: yes Status:  smaller Trauma: no Redness: yes Bruising: no Recent infection: no Swollen lymph nodes: no Associated signs and symptoms: none  HYPERTENSION / HYPERLIPIDEMIA Satisfied with current treatment? yes Duration of hypertension: chronic BP monitoring frequency: not checking BP medication side effects: no Duration of hyperlipidemia: chronic Medication compliance: excellent compliance Aspirin: no Recent stressors: no Recurrent headaches: no Visual changes: no Palpitations: no Dyspnea: no Chest pain: no Lower extremity edema: no Dizzy/lightheaded: no  LOW TESTOSTERONE- not on testosterone any more, didn't want to take it any more Duration: chronic Decreased libido: no Fatigue: no Depressed mood: no Muscle weakness: no Erectile dysfunction: no  Interim Problems from his last visit: no  Depression Screen done today and results listed below:  Depression screen [facility] 2/9 09/28/2015  Decreased Interest 0  Down, Depressed, Hopeless 0  PHQ - 2 Score 0     Past Medical History:  Past Medical History:  Diagnosis Date  . Allergy   . Benign hypertensive renal disease   . Chronic kidney disease, stage I   . ED (erectile dysfunction)   . Hyperlipidemia   . Hypertension   . Hypogonadism in  male   . Obesity   . Sleep apnea   . Thrombocytosis ([place])     Surgical History:  Past Surgical History:  Procedure Laterality Date  . LUMBAR DISC SURGERY  1990   l4/5     Medications:  No current outpatient prescriptions on file prior to visit.   No current facility-administered medications on file prior to visit.     Allergies:  No Known Allergies  Social History:  Social History   Social History  . Marital status: Married    Spouse name: N/A  . Number of children: N/A  . Years of education: N/A   Occupational History  . Not on file.   Social History Main Topics  . Smoking status: Never Smoker  . Smokeless tobacco: Never Used  . Alcohol use No  . Drug use: No  . Sexual activity: Yes    Birth control/ protection: Condom   Other Topics Concern  . Not on file   Social History Narrative  . No narrative on file   History  Smoking Status  . Never Smoker  Smokeless Tobacco  . Never Used   History  Alcohol Use No    Family History:  Family History  Problem Relation Age of Onset  . Hypertension Father   . Hyperlipidemia Father   . Diabetes Brother   . Diabetes Mother   . Cancer Maternal Grandmother   . Cancer Maternal Grandfather   . Diabetes Paternal Grandmother   . Thyroid disease Brother  Past medical history, surgical history, medications, allergies, family history and social history reviewed with patient today and changes made to appropriate areas of the chart.   Review of Systems  Constitutional: Negative.   HENT: Negative.   Eyes: Negative.   Respiratory: Negative.   Cardiovascular: Negative.   Gastrointestinal: Negative.   Genitourinary: Negative.   Musculoskeletal: Positive for back pain. Negative for falls, joint pain, myalgias and neck pain.  Skin: Negative.   Neurological: Positive for tingling (R foot). Negative for dizziness, tremors, sensory change, speech change, focal weakness, seizures and loss of consciousness.    Endo/Heme/Allergies: Negative.   Psychiatric/Behavioral: Negative.     All other ROS negative except what is listed above and in the HPI.      Objective:    BP 132/82   Pulse 74   Temp 98.1 F (36.7 C)   Ht 5' 8.3" (1.735 m)   Wt 254 lb (115.2 kg)   SpO2 98%   BMI 38.28 kg/m   Wt Readings from Last 3 Encounters:  09/28/15 254 lb (115.2 kg)  01/18/15 266 lb (120.7 kg)  12/21/14 260 lb (117.9 kg)    Physical Exam  Constitutional: He is oriented to person, place, and time. He appears well-developed and well-nourished. No distress.  HENT:  Head: Normocephalic and atraumatic.  Right Ear: Hearing, tympanic membrane, external ear and ear canal normal.  Left Ear: Hearing, tympanic membrane, external ear and ear canal normal.  Nose: Nose normal.  Mouth/Throat: Uvula is midline, oropharynx is clear and moist and mucous membranes are normal. No oropharyngeal exudate.  Non-fluctuant area about 2inches on back of L side of his neck  Eyes: Conjunctivae, EOM and lids are normal. Pupils are equal, round, and reactive to light. Right eye exhibits no discharge. Left eye exhibits no discharge. No scleral icterus.  Neck: Normal range of motion. Neck supple. No JVD present. No tracheal deviation present. No thyromegaly present.  Cardiovascular: Normal rate, regular rhythm, normal heart sounds and intact distal pulses.  Exam reveals no gallop and no friction rub.   No murmur heard. Pulmonary/Chest: Effort normal and breath sounds normal. No stridor. No respiratory distress. He has no wheezes. He has no rales. He exhibits no tenderness.  Abdominal: Soft. Bowel sounds are normal. He exhibits no distension and no mass. There is no tenderness. There is no rebound and no guarding. Hernia confirmed negative in the right inguinal area and confirmed negative in the left inguinal area.  Genitourinary: Testes normal and penis normal. Right testis shows no mass, no swelling and no tenderness. Right testis is  descended. Cremasteric reflex is not absent on the right side. Left testis shows no mass, no swelling and no tenderness. Left testis is descended. Cremasteric reflex is not absent on the left side. Circumcised. No penile tenderness.  Lymphadenopathy:    He has no cervical adenopathy.  Neurological: He is alert and oriented to person, place, and time. He has normal reflexes. He displays normal reflexes. No cranial nerve deficit. He exhibits normal muscle tone. Coordination normal.  Skin: Skin is warm, dry and intact. No rash noted. He is not diaphoretic. No erythema. No pallor.  Psychiatric: He has a normal mood and affect. His speech is normal and behavior is normal. Judgment and thought content normal. Cognition and memory are normal.  Nursing note and vitals reviewed. Back Exam:    Inspection:  Normal spinal curvature.  No deformity, ecchymosis, erythema, or lesions     Palpation:  Midline spinal tenderness: no      Paralumbar tenderness: yes Right     Parathoracic tenderness: no      Buttocks tenderness: yesRight     Range of Motion:      Flexion: Fingers to Knees     Extension:Decreased     Lateral bending:Decreased    Rotation:Decreased    Neuro Exam:Lower extremity DTRs normal & symmetric.  Strength and sensation intact.    Special Tests:      Straight leg raise:negative   Results for orders placed or performed in visit on 01/18/15  Testosterone, free, total  Result Value Ref Range   Testosterone, Free 10.1 6.8 - 21.5 pg/mL  Basic metabolic panel  Result Value Ref Range   Glucose 89 65 - 99 mg/dL   BUN 11 6 - 24 mg/dL   Creatinine, Ser 1.05 0.76 - 1.27 mg/dL   GFR calc non Af Amer 84 >59 mL/min/1.73   GFR calc Af Amer 97 >59 mL/min/1.73   BUN/Creatinine Ratio 10 9 - 20   Sodium 140 136 - 144 mmol/L   Potassium 4.9 3.5 - 5.2 mmol/L   Chloride 99 97 - 106 mmol/L   CO2 25 18 - 29 mmol/L   Calcium 9.4 8.7 - 10.2 mg/dL      Assessment & Plan:   Problem List Items  Addressed This Visit      Endocrine   Hypogonadism in male    Does not want treatment at this time. Continue to monitor.       Relevant Orders   Comprehensive metabolic panel   Testosterone, free, total     Genitourinary   Benign hypertensive renal disease    Better on recheck. Continue current regimen. Continue to monitor.       Relevant Orders   Comprehensive metabolic panel   Microalbumin, Urine Waived     Other   Hyperlipidemia    Borderline. Continue current regimen. Continue to monitor.       Relevant Medications   amLODipine (NORVASC) 5 MG tablet   lisinopril (PRINIVIL,ZESTRIL) 2.5 MG tablet   Other Relevant Orders   Comprehensive metabolic panel   Lipid Panel Piccolo, Waived    Other Visit Diagnoses    Routine general medical examination at a health care facility    -  Primary   Refused vaccines. Screening labs drawn today. Continue diet and exercise.    Relevant Orders   Bayer DCA Hb A1c Waived   CBC with Differential/Platelet   Comprehensive metabolic panel   Lipid Panel Piccolo, Waived   Microalbumin, Urine Waived   TSH   UA/M w/rflx Culture, Routine   Testosterone, free, total   Sciatica of right side       Will treat with naproxen and flexeril. Stretches given. Recheck in 1 month.    Relevant Medications   cyclobenzaprine (FLEXERIL) 10 MG tablet   Cellulitis of head except face       Will treat with bactrim. Call if not getting better or getting worse.       LABORATORY TESTING:  Health maintenance labs ordered today as discussed above.   IMMUNIZATIONS:   - Tdap: Tetanus vaccination status reviewed: Declined. - Influenza: Postponed to flu season - Pneumovax: Not applicable  PATIENT COUNSELING:    Sexuality: Discussed sexually transmitted diseases, partner selection, use of condoms, avoidance of unintended pregnancy  and contraceptive alternatives.   Advised to avoid cigarette smoking.  I discussed with the patient that most people either  abstain  from alcohol or drink within safe limits (<=14/week and <=4 drinks/occasion for males, <=7/weeks and <= 3 drinks/occasion for females) and that the risk for alcohol disorders and other health effects rises proportionally with the number of drinks per week and how often a drinker exceeds daily limits.  Discussed cessation/primary prevention of drug use and availability of treatment for abuse.   Diet: Encouraged to adjust caloric intake to maintain  or achieve ideal body weight, to reduce intake of dietary saturated fat and total fat, to limit sodium intake by avoiding high sodium foods and not adding table salt, and to maintain adequate dietary potassium and calcium preferably from fresh fruits, vegetables, and low-fat dairy products.    stressed the importance of regular exercise  Injury prevention: Discussed safety belts, safety helmets, smoke detector, smoking near bedding or upholstery.   Dental health: Discussed importance of regular tooth brushing, flossing, and dental visits.   Follow up plan: NEXT PREVENTATIVE PHYSICAL DUE IN 1 YEAR. Return in about 4 weeks (around 10/26/2015) for follow up back.

## 2015-09-28 NOTE — Assessment & Plan Note (Signed)
Does not want treatment at this time. Continue to monitor.

## 2015-09-28 NOTE — Assessment & Plan Note (Signed)
Better on recheck. Continue current regimen. Continue to monitor.  

## 2015-09-28 NOTE — Patient Instructions (Addendum)
Sciatica With Rehab The sciatic nerve runs from the back down the leg and is responsible for sensation and control of the muscles in the back (posterior) side of the thigh, lower leg, and foot. Sciatica is a condition that is characterized by inflammation of this nerve.  SYMPTOMS   Signs of nerve damage, including numbness and/or weakness along the posterior side of the lower extremity.  Pain in the back of the thigh that may also travel down the leg.  Pain that worsens when sitting for long periods of time.  Occasionally, pain in the back or buttock. CAUSES  Inflammation of the sciatic nerve is the cause of sciatica. The inflammation is due to something irritating the nerve. Common sources of irritation include:  Sitting for long periods of time.  Direct trauma to the nerve.  Arthritis of the spine.  Herniated or ruptured disk.  Slipping of the vertebrae (spondylolisthesis).  Pressure from soft tissues, such as muscles or ligament-like tissue (fascia). RISK INCREASES WITH:  Sports that place pressure or stress on the spine (football or weightlifting).  Poor strength and flexibility.  Failure to warm up properly before activity.  Family history of low back pain or disk disorders.  Previous back injury or surgery.  Poor body mechanics, especially when lifting, or poor posture. PREVENTION   Warm up and stretch properly before activity.  Maintain physical fitness:  Strength, flexibility, and endurance.  Cardiovascular fitness.  Learn and use proper technique, especially with posture and lifting. When possible, have coach correct improper technique.  Avoid activities that place stress on the spine. PROGNOSIS If treated properly, then sciatica usually resolves within 6 weeks. However, occasionally surgery is necessary.  RELATED COMPLICATIONS   Permanent nerve damage, including pain, numbness, tingle, or weakness.  Chronic back pain.  Risks of surgery: infection,  bleeding, nerve damage, or damage to surrounding tissues. TREATMENT Treatment initially involves resting from any activities that aggravate your symptoms. The use of ice and medication may help reduce pain and inflammation. The use of strengthening and stretching exercises may help reduce pain with activity. These exercises may be performed at home or with referral to a therapist. A therapist may recommend further treatments, such as transcutaneous electronic nerve stimulation (TENS) or ultrasound. Your caregiver may recommend corticosteroid injections to help reduce inflammation of the sciatic nerve. If symptoms persist despite non-surgical (conservative) treatment, then surgery may be recommended. MEDICATION  If pain medication is necessary, then nonsteroidal anti-inflammatory medications, such as aspirin and ibuprofen, or other minor pain relievers, such as acetaminophen, are often recommended.  Do not take pain medication for 7 days before surgery.  Prescription pain relievers may be given if deemed necessary by your caregiver. Use only as directed and only as much as you need.  Ointments applied to the skin may be helpful.  Corticosteroid injections may be given by your caregiver. These injections should be reserved for the most serious cases, because they may only be given a certain number of times. HEAT AND COLD  Cold treatment (icing) relieves pain and reduces inflammation. Cold treatment should be applied for 10 to 15 minutes every 2 to 3 hours for inflammation and pain and immediately after any activity that aggravates your symptoms. Use ice packs or massage the area with a piece of ice (ice massage).  Heat treatment may be used prior to performing the stretching and strengthening activities prescribed by your caregiver, physical therapist, or athletic trainer. Use a heat pack or soak the injury in warm water.   SEEK MEDICAL CARE IF:  Treatment seems to offer no benefit, or the condition  worsens.  Any medications produce adverse side effects. EXERCISES  RANGE OF MOTION (ROM) AND STRETCHING EXERCISES - Sciatica Most people with sciatic will find that their symptoms worsen with either excessive bending forward (flexion) or arching at the low back (extension). The exercises which will help resolve your symptoms will focus on the opposite motion. Your physician, physical therapist or athletic trainer will help you determine which exercises will be most helpful to resolve your low back pain. Do not complete any exercises without first consulting with your clinician. Discontinue any exercises which worsen your symptoms until you speak to your clinician. If you have pain, numbness or tingling which travels down into your buttocks, leg or foot, the goal of the therapy is for these symptoms to move closer to your back and eventually resolve. Occasionally, these leg symptoms will get better, but your low back pain may worsen; this is typically an indication of progress in your rehabilitation. Be certain to be very alert to any changes in your symptoms and the activities in which you participated in the 24 hours prior to the change. Sharing this information with your clinician will allow him/her to most efficiently treat your condition. These exercises may help you when beginning to rehabilitate your injury. Your symptoms may resolve with or without further involvement from your physician, physical therapist or athletic trainer. While completing these exercises, remember:   Restoring tissue flexibility helps normal motion to return to the joints. This allows healthier, less painful movement and activity.  An effective stretch should be held for at least 30 seconds.  A stretch should never be painful. You should only feel a gentle lengthening or release in the stretched tissue. FLEXION RANGE OF MOTION AND STRETCHING EXERCISES: STRETCH - Flexion, Single Knee to Chest   Lie on a firm bed or floor  with both legs extended in front of you.  Keeping one leg in contact with the floor, bring your opposite knee to your chest. Hold your leg in place by either grabbing behind your thigh or at your knee.  Pull until you feel a gentle stretch in your low back. Hold __________ seconds.  Slowly release your grasp and repeat the exercise with the opposite side. Repeat __________ times. Complete this exercise __________ times per day.  STRETCH - Flexion, Double Knee to Chest  Lie on a firm bed or floor with both legs extended in front of you.  Keeping one leg in contact with the floor, bring your opposite knee to your chest.  Tense your stomach muscles to support your back and then lift your other knee to your chest. Hold your legs in place by either grabbing behind your thighs or at your knees.  Pull both knees toward your chest until you feel a gentle stretch in your low back. Hold __________ seconds.  Tense your stomach muscles and slowly return one leg at a time to the floor. Repeat __________ times. Complete this exercise __________ times per day.  STRETCH - Low Trunk Rotation   Lie on a firm bed or floor. Keeping your legs in front of you, bend your knees so they are both pointed toward the ceiling and your feet are flat on the floor.  Extend your arms out to the side. This will stabilize your upper body by keeping your shoulders in contact with the floor.  Gently and slowly drop both knees together to one side until   you feel a gentle stretch in your low back. Hold for __________ seconds.  Tense your stomach muscles to support your low back as you bring your knees back to the starting position. Repeat the exercise to the other side. Repeat __________ times. Complete this exercise __________ times per day  EXTENSION RANGE OF MOTION AND FLEXIBILITY EXERCISES: STRETCH - Extension, Prone on Elbows  Lie on your stomach on the floor, a bed will be too soft. Place your palms about shoulder  width apart and at the height of your head.  Place your elbows under your shoulders. If this is too painful, stack pillows under your chest.  Allow your body to relax so that your hips drop lower and make contact more completely with the floor.  Hold this position for __________ seconds.  Slowly return to lying flat on the floor. Repeat __________ times. Complete this exercise __________ times per day.  RANGE OF MOTION - Extension, Prone Press Ups  Lie on your stomach on the floor, a bed will be too soft. Place your palms about shoulder width apart and at the height of your head.  Keeping your back as relaxed as possible, slowly straighten your elbows while keeping your hips on the floor. You may adjust the placement of your hands to maximize your comfort. As you gain motion, your hands will come more underneath your shoulders.  Hold this position __________ seconds.  Slowly return to lying flat on the floor. Repeat __________ times. Complete this exercise __________ times per day.  STRENGTHENING EXERCISES - Sciatica  These exercises may help you when beginning to rehabilitate your injury. These exercises should be done near your "sweet spot." This is the neutral, low-back arch, somewhere between fully rounded and fully arched, that is your least painful position. When performed in this safe range of motion, these exercises can be used for people who have either a flexion or extension based injury. These exercises may resolve your symptoms with or without further involvement from your physician, physical therapist or athletic trainer. While completing these exercises, remember:   Muscles can gain both the endurance and the strength needed for everyday activities through controlled exercises.  Complete these exercises as instructed by your physician, physical therapist or athletic trainer. Progress with the resistance and repetition exercises only as your caregiver advises.  You may  experience muscle soreness or fatigue, but the pain or discomfort you are trying to eliminate should never worsen during these exercises. If this pain does worsen, stop and make certain you are following the directions exactly. If the pain is still present after adjustments, discontinue the exercise until you can discuss the trouble with your clinician. STRENGTHENING - Deep Abdominals, Pelvic Tilt   Lie on a firm bed or floor. Keeping your legs in front of you, bend your knees so they are both pointed toward the ceiling and your feet are flat on the floor.  Tense your lower abdominal muscles to press your low back into the floor. This motion will rotate your pelvis so that your tail bone is scooping upwards rather than pointing at your feet or into the floor.  With a gentle tension and even breathing, hold this position for __________ seconds. Repeat __________ times. Complete this exercise __________ times per day.  STRENGTHENING - Abdominals, Crunches   Lie on a firm bed or floor. Keeping your legs in front of you, bend your knees so they are both pointed toward the ceiling and your feet are flat on the   floor. Cross your arms over your chest.  Slightly tip your chin down without bending your neck.  Tense your abdominals and slowly lift your trunk high enough to just clear your shoulder blades. Lifting higher can put excessive stress on the low back and does not further strengthen your abdominal muscles.  Control your return to the starting position. Repeat __________ times. Complete this exercise __________ times per day.  STRENGTHENING - Quadruped, Opposite UE/LE Lift  Assume a hands and knees position on a firm surface. Keep your hands under your shoulders and your knees under your hips. You may place padding under your knees for comfort.  Find your neutral spine and gently tense your abdominal muscles so that you can maintain this position. Your shoulders and hips should form a rectangle  that is parallel with the floor and is not twisted.  Keeping your trunk steady, lift your right hand no higher than your shoulder and then your left leg no higher than your hip. Make sure you are not holding your breath. Hold this position __________ seconds.  Continuing to keep your abdominal muscles tense and your back steady, slowly return to your starting position. Repeat with the opposite arm and leg. Repeat __________ times. Complete this exercise __________ times per day.  STRENGTHENING - Abdominals and Quadriceps, Straight Leg Raise   Lie on a firm bed or floor with both legs extended in front of you.  Keeping one leg in contact with the floor, bend the other knee so that your foot can rest flat on the floor.  Find your neutral spine, and tense your abdominal muscles to maintain your spinal position throughout the exercise.  Slowly lift your straight leg off the floor about 6 inches for a count of 15, making sure to not hold your breath.  Still keeping your neutral spine, slowly lower your leg all the way to the floor. Repeat this exercise with each leg __________ times. Complete this exercise __________ times per day. POSTURE AND BODY MECHANICS CONSIDERATIONS - Sciatica Keeping correct posture when sitting, standing or completing your activities will reduce the stress put on different body tissues, allowing injured tissues a chance to heal and limiting painful experiences. The following are general guidelines for improved posture. Your physician or physical therapist will provide you with any instructions specific to your needs. While reading these guidelines, remember:  The exercises prescribed by your provider will help you have the flexibility and strength to maintain correct postures.  The correct posture provides the optimal environment for your joints to work. All of your joints have less wear and tear when properly supported by a spine with good posture. This means you will  experience a healthier, less painful body.  Correct posture must be practiced with all of your activities, especially prolonged sitting and standing. Correct posture is as important when doing repetitive low-stress activities (typing) as it is when doing a single heavy-load activity (lifting). RESTING POSITIONS Consider which positions are most painful for you when choosing a resting position. If you have pain with flexion-based activities (sitting, bending, stooping, squatting), choose a position that allows you to rest in a less flexed posture. You would want to avoid curling into a fetal position on your side. If your pain worsens with extension-based activities (prolonged standing, working overhead), avoid resting in an extended position such as sleeping on your stomach. Most people will find more comfort when they rest with their spine in a more neutral position, neither too rounded nor too   arched. Lying on a non-sagging bed on your side with a pillow between your knees, or on your back with a pillow under your knees will often provide some relief. Keep in mind, being in any one position for a prolonged period of time, no matter how correct your posture, can still lead to stiffness. PROPER SITTING POSTURE In order to minimize stress and discomfort on your spine, you must sit with correct posture Sitting with good posture should be effortless for a healthy body. Returning to good posture is a gradual process. Many people can work toward this most comfortably by using various supports until they have the flexibility and strength to maintain this posture on their own. When sitting with proper posture, your ears will fall over your shoulders and your shoulders will fall over your hips. You should use the back of the chair to support your upper back. Your low back will be in a neutral position, just slightly arched. You may place a small pillow or folded towel at the base of your low back for support.  When  working at a desk, create an environment that supports good, upright posture. Without extra support, muscles fatigue and lead to excessive strain on joints and other tissues. Keep these recommendations in mind: CHAIR:   A chair should be able to slide under your desk when your back makes contact with the back of the chair. This allows you to work closely.  The chair's height should allow your eyes to be level with the upper part of your monitor and your hands to be slightly lower than your elbows. BODY POSITION  Your feet should make contact with the floor. If this is not possible, use a foot rest.  Keep your ears over your shoulders. This will reduce stress on your neck and low back. INCORRECT SITTING POSTURES   If you are feeling tired and unable to assume a healthy sitting posture, do not slouch or slump. This puts excessive strain on your back tissues, causing more damage and pain. Healthier options include:  Using more support, like a lumbar pillow.  Switching tasks to something that requires you to be upright or walking.  Talking a brief walk.  Lying down to rest in a neutral-spine position. PROLONGED STANDING WHILE SLIGHTLY LEANING FORWARD  When completing a task that requires you to lean forward while standing in one place for a long time, place either foot up on a stationary 2-4 inch high object to help maintain the best posture. When both feet are on the ground, the low back tends to lose its slight inward curve. If this curve flattens (or becomes too large), then the back and your other joints will experience too much stress, fatigue more quickly and can cause pain.  CORRECT STANDING POSTURES Proper standing posture should be assumed with all daily activities, even if they only take a few moments, like when brushing your teeth. As in sitting, your ears should fall over your shoulders and your shoulders should fall over your hips. You should keep a slight tension in your abdominal  muscles to brace your spine. Your tailbone should point down to the ground, not behind your body, resulting in an over-extended swayback posture.  INCORRECT STANDING POSTURES  Common incorrect standing postures include a forward head, locked knees and/or an excessive swayback. WALKING Walk with an upright posture. Your ears, shoulders and hips should all line-up. PROLONGED ACTIVITY IN A FLEXED POSITION When completing a task that requires you to bend forward   at your waist or lean over a low surface, try to find a way to stabilize 3 of 4 of your limbs. You can place a hand or elbow on your thigh or rest a knee on the surface you are reaching across. This will provide you more stability so that your muscles do not fatigue as quickly. By keeping your knees relaxed, or slightly bent, you will also reduce stress across your low back. CORRECT LIFTING TECHNIQUES DO :   Assume a wide stance. This will provide you more stability and the opportunity to get as close as possible to the object which you are lifting.  Tense your abdominals to brace your spine; then bend at the knees and hips. Keeping your back locked in a neutral-spine position, lift using your leg muscles. Lift with your legs, keeping your back straight.  Test the weight of unknown objects before attempting to lift them.  Try to keep your elbows locked down at your sides in order get the best strength from your shoulders when carrying an object.  Always ask for help when lifting heavy or awkward objects. INCORRECT LIFTING TECHNIQUES DO NOT:   Lock your knees when lifting, even if it is a small object.  Bend and twist. Pivot at your feet or move your feet when needing to change directions.  Assume that you cannot safely pick up a paperclip without proper posture.   This information is not intended to replace advice given to you by your health care provider. Make sure you discuss any questions you have with your health care provider.     Document Released: 02/06/2005 Document Revised: 06/23/2014 Document Reviewed: 05/21/2008 Elsevier Interactive Patient Education 2016 [place] Maintenance, Male A healthy lifestyle and preventative care can promote health and wellness.  Maintain regular health, dental, and eye exams.  Eat a healthy diet. Foods like vegetables, fruits, whole grains, low-fat dairy products, and lean protein foods contain the nutrients you need and are low in calories. Decrease your intake of foods high in solid fats, added sugars, and salt. Get information about a proper diet from your health care provider, if necessary.  Regular physical exercise is one of the most important things you can do for your health. Most adults should get at least 150 minutes of moderate-intensity exercise (any activity that increases your heart rate and causes you to sweat) each week. In addition, most adults need muscle-strengthening exercises on 2 or more days a week.   Maintain a healthy weight. The body mass index (BMI) is a screening tool to identify possible weight problems. It provides an estimate of body fat based on height and weight. Your health care provider can find your BMI and can help you achieve or maintain a healthy weight. For males 20 years and older:  A BMI below 18.5 is considered underweight.  A BMI of 18.5 to 24.9 is normal.  A BMI of 25 to 29.9 is considered overweight.  A BMI of 30 and above is considered obese.  Maintain normal blood lipids and cholesterol by exercising and minimizing your intake of saturated fat. Eat a balanced diet with plenty of fruits and vegetables. Blood tests for lipids and cholesterol should begin at age 66 and be repeated every 5 years. If your lipid or cholesterol levels are high, you are over age 70, or you are at high risk for heart disease, you may need your cholesterol levels checked more frequently.Ongoing high lipid and cholesterol levels should be treated with  medicines if diet and exercise are not working.  If you smoke, find out from your health care provider how to quit. If you do not use tobacco, do not start.  Lung cancer screening is recommended for adults aged 16-80 years who are at high risk for developing lung cancer because of a history of smoking. A yearly low-dose CT scan of the lungs is recommended for people who have at least a 30-pack-year history of smoking and are current smokers or have quit within the past 15 years. A pack year of smoking is smoking an average of 1 pack of cigarettes a day for 1 year (for example, a 30-pack-year history of smoking could mean smoking 1 pack a day for 30 years or 2 packs a day for 15 years). Yearly screening should continue until the smoker has stopped smoking for at least 15 years. Yearly screening should be stopped for people who develop a health problem that would prevent them from having lung cancer treatment.  If you choose to drink alcohol, do not have more than 2 drinks per day. One drink is considered to be 12 oz (360 mL) of beer, 5 oz (150 mL) of wine, or 1.5 oz (45 mL) of liquor.  Avoid the use of street drugs. Do not share needles with anyone. Ask for help if you need support or instructions about stopping the use of drugs.  High blood pressure causes heart disease and increases the risk of stroke. High blood pressure is more likely to develop in:  People who have blood pressure in the end of the normal range (100-139/85-89 mm Hg).  People who are overweight or obese.  People who are African American.  If you are 34-96 years of age, have your blood pressure checked every 3-5 years. If you are 23 years of age or older, have your blood pressure checked every year. You should have your blood pressure measured twice--once when you are at a hospital or clinic, and once when you are not at a hospital or clinic. Record the average of the two measurements. To check your blood pressure when you are not  at a hospital or clinic, you can use:  An automated blood pressure machine at a pharmacy.  A home blood pressure monitor.  If you are 75-28 years old, ask your health care provider if you should take aspirin to prevent heart disease.  Diabetes screening involves taking a blood sample to check your fasting blood sugar level. This should be done once every 3 years after age 17 if you are at a normal weight and without risk factors for diabetes. Testing should be considered at a younger age or be carried out more frequently if you are overweight and have at least 1 risk factor for diabetes.  Colorectal cancer can be detected and often prevented. Most routine colorectal cancer screening begins at the age of 22 and continues through age 66. However, your health care provider may recommend screening at an earlier age if you have risk factors for colon cancer. On a yearly basis, your health care provider may provide home test kits to check for hidden blood in the stool. A small camera at the end of a tube may be used to directly examine the colon (sigmoidoscopy or colonoscopy) to detect the earliest forms of colorectal cancer. Talk to your health care provider about this at age 27 when routine screening begins. A direct exam of the colon should be repeated every 5-10 years through age 26,  unless early forms of precancerous polyps or small growths are found.  People who are at an increased risk for hepatitis B should be screened for this virus. You are considered at high risk for hepatitis B if:  You were born in a country where hepatitis B occurs often. Talk with your health care provider about which countries are considered high risk.  Your parents were born in a high-risk country and you have not received a shot to protect against hepatitis B (hepatitis B vaccine).  You have HIV or AIDS.  You use needles to inject street drugs.  You live with, or have sex with, someone who has hepatitis B.  You  are a man who has sex with other men (MSM).  You get hemodialysis treatment.  You take certain medicines for conditions like cancer, organ transplantation, and autoimmune conditions.  Hepatitis C blood testing is recommended for all people born from [birth year] through 1964 and any individual with known risk factors for hepatitis C.  Healthy men should no longer receive prostate-specific antigen (PSA) blood tests as part of routine cancer screening. Talk to your health care provider about prostate cancer screening.  Testicular cancer screening is not recommended for adolescents or adult males who have no symptoms. Screening includes self-exam, a health care provider exam, and other screening tests. Consult with your health care provider about any symptoms you have or any concerns you have about testicular cancer.  Practice safe sex. Use condoms and avoid high-risk sexual practices to reduce the spread of sexually transmitted infections (STIs).  You should be screened for STIs, including gonorrhea and chlamydia if:  You are sexually active and are younger than 24 years.  You are older than 24 years, and your health care provider tells you that you are at risk for this type of infection.  Your sexual activity has changed since you were last screened, and you are at an increased risk for chlamydia or gonorrhea. Ask your health care provider if you are at risk.  If you are at risk of being infected with HIV, it is recommended that you take a prescription medicine daily to prevent HIV infection. This is called pre-exposure prophylaxis (PrEP). You are considered at risk if:  You are a man who has sex with other men (MSM).  You are a heterosexual man who is sexually active with multiple partners.  You take drugs by injection.  You are sexually active with a partner who has HIV.  Talk with your health care provider about whether you are at high risk of being infected with HIV. If you choose to begin  PrEP, you should first be tested for HIV. You should then be tested every 3 months for as long as you are taking PrEP.  Use sunscreen. Apply sunscreen liberally and repeatedly throughout the day. You should seek shade when your shadow is shorter than you. Protect yourself by wearing long sleeves, pants, a wide-brimmed hat, and sunglasses year round whenever you are outdoors.  Tell your health care provider of new moles or changes in moles, especially if there is a change in shape or color. Also, tell your health care provider if a mole is larger than the size of a pencil eraser.  A one-time screening for abdominal aortic aneurysm (AAA) and surgical repair of large AAAs by ultrasound is recommended for men aged 17-75 years who are current or former smokers.  Stay current with your vaccines (immunizations).   This information is not intended to replace  advice given to you by your health care provider. Make sure you discuss any questions you have with your health care provider.   Document Released: 08/05/2007 Document Revised: 02/27/2014 Document Reviewed: 07/04/2010 Elsevier Interactive Patient Education [employer].

## 2015-09-29 ENCOUNTER — Encounter: Payer: Self-pay | Admitting: Family Medicine

## 2015-09-29 LAB — COMPREHENSIVE METABOLIC PANEL
ALBUMIN: 4.3 g/dL (ref 3.5–5.5)
ALK PHOS: 96 IU/L (ref 39–117)
ALT: 50 IU/L — ABNORMAL HIGH (ref 0–44)
AST: 35 IU/L (ref 0–40)
Albumin/Globulin Ratio: 1.3 (ref 1.2–2.2)
BUN / CREAT RATIO: 10 (ref 9–20)
BUN: 11 mg/dL (ref 6–24)
Bilirubin Total: 0.3 mg/dL (ref 0.0–1.2)
CO2: 21 mmol/L (ref 18–29)
CREATININE: 1.15 mg/dL (ref 0.76–1.27)
Calcium: 10 mg/dL (ref 8.7–10.2)
Chloride: 99 mmol/L (ref 96–106)
GFR, EST AFRICAN AMERICAN: 86 mL/min/{1.73_m2} (ref >59)
GFR, EST NON AFRICAN AMERICAN: 74 mL/min/{1.73_m2} (ref >59)
GLOBULIN, TOTAL: 3.2 g/dL (ref 1.5–4.5)
Glucose: 100 mg/dL — ABNORMAL HIGH (ref 65–99)
Potassium: 5.1 mmol/L (ref 3.5–5.2)
SODIUM: 140 mmol/L (ref 134–144)
TOTAL PROTEIN: 7.5 g/dL (ref 6.0–8.5)

## 2015-09-29 LAB — CBC WITH DIFFERENTIAL/PLATELET
Basophils Absolute: 0 10*3/uL (ref 0.0–0.2)
Basos: 1 %
EOS (ABSOLUTE): 0.2 10*3/uL (ref 0.0–0.4)
EOS: 3 %
HEMATOCRIT: 49 % (ref 37.5–51.0)
HEMOGLOBIN: 16.3 g/dL (ref 12.6–17.7)
Immature Grans (Abs): 0 10*3/uL (ref 0.0–0.1)
Immature Granulocytes: 0 %
LYMPHS ABS: 3.6 10*3/uL — AB (ref 0.7–3.1)
Lymphs: 50 %
MCH: 31 pg (ref 26.6–33.0)
MCHC: 33.3 g/dL (ref 31.5–35.7)
MCV: 93 fL (ref 79–97)
MONOCYTES: 7 %
Monocytes Absolute: 0.5 10*3/uL (ref 0.1–0.9)
NEUTROS ABS: 2.8 10*3/uL (ref 1.4–7.0)
Neutrophils: 39 %
Platelets: 434 10*3/uL — ABNORMAL HIGH (ref 150–379)
RBC: 5.25 x10E6/uL (ref 4.14–5.80)
RDW: 13.4 % (ref 12.3–15.4)
WBC: 7.3 10*3/uL (ref 3.4–10.8)

## 2015-09-29 LAB — TSH: TSH: 2.03 u[IU]/mL (ref 0.450–4.500)

## 2015-09-29 LAB — TESTOSTERONE, FREE, TOTAL, SHBG
Sex Hormone Binding: 22.3 nmol/L (ref 16.5–55.9)
Testosterone, Free: 8.7 pg/mL (ref 6.8–21.5)
Testosterone: 174 ng/dL — ABNORMAL LOW (ref 264–916)

## 2015-10-25 ENCOUNTER — Other Ambulatory Visit: Payer: Self-pay | Admitting: Family Medicine

## 2015-10-29 ENCOUNTER — Ambulatory Visit: Payer: BLUE CROSS/BLUE SHIELD | Admitting: Family Medicine

## 2015-12-07 ENCOUNTER — Other Ambulatory Visit: Payer: Self-pay | Admitting: Family Medicine

## 2015-12-29 DIAGNOSIS — Z23 Encounter for immunization: Secondary | ICD-10-CM | POA: Diagnosis not present

## 2016-03-13 DIAGNOSIS — G4733 Obstructive sleep apnea (adult) (pediatric): Secondary | ICD-10-CM | POA: Diagnosis not present

## 2016-04-04 ENCOUNTER — Other Ambulatory Visit: Payer: Self-pay | Admitting: Family Medicine

## 2016-07-17 ENCOUNTER — Other Ambulatory Visit: Payer: Self-pay | Admitting: Family Medicine

## 2016-09-28 ENCOUNTER — Ambulatory Visit (INDEPENDENT_AMBULATORY_CARE_PROVIDER_SITE_OTHER): Payer: BLUE CROSS/BLUE SHIELD | Admitting: Family Medicine

## 2016-09-28 ENCOUNTER — Encounter: Payer: Self-pay | Admitting: Family Medicine

## 2016-09-28 VITALS — BP 132/85 | HR 72 | Temp 98.4°F | Wt 253.1 lb

## 2016-09-28 DIAGNOSIS — F321 Major depressive disorder, single episode, moderate: Secondary | ICD-10-CM | POA: Diagnosis not present

## 2016-09-28 DIAGNOSIS — N390 Urinary tract infection, site not specified: Secondary | ICD-10-CM

## 2016-09-28 DIAGNOSIS — E291 Testicular hypofunction: Secondary | ICD-10-CM | POA: Diagnosis not present

## 2016-09-28 DIAGNOSIS — M2550 Pain in unspecified joint: Secondary | ICD-10-CM

## 2016-09-28 DIAGNOSIS — E782 Mixed hyperlipidemia: Secondary | ICD-10-CM

## 2016-09-28 DIAGNOSIS — I129 Hypertensive chronic kidney disease with stage 1 through stage 4 chronic kidney disease, or unspecified chronic kidney disease: Secondary | ICD-10-CM

## 2016-09-28 DIAGNOSIS — N181 Chronic kidney disease, stage 1: Secondary | ICD-10-CM

## 2016-09-28 DIAGNOSIS — R5383 Other fatigue: Secondary | ICD-10-CM

## 2016-09-28 DIAGNOSIS — Z23 Encounter for immunization: Secondary | ICD-10-CM | POA: Diagnosis not present

## 2016-09-28 DIAGNOSIS — Z125 Encounter for screening for malignant neoplasm of prostate: Secondary | ICD-10-CM

## 2016-09-28 DIAGNOSIS — Z0000 Encounter for general adult medical examination without abnormal findings: Secondary | ICD-10-CM

## 2016-09-28 DIAGNOSIS — R8281 Pyuria: Secondary | ICD-10-CM

## 2016-09-28 LAB — MICROSCOPIC EXAMINATION: RBC MICROSCOPIC, UA: NONE SEEN /HPF (ref 0–?)

## 2016-09-28 NOTE — Assessment & Plan Note (Signed)
Possibly contributing to current symptoms. Labs checked again today. Await results.

## 2016-09-28 NOTE — Assessment & Plan Note (Signed)
Checking labs today. Await results.  

## 2016-09-28 NOTE — Assessment & Plan Note (Signed)
Under good control. Refills given today. Call with any concerns.

## 2016-09-28 NOTE — Patient Instructions (Addendum)
Health Maintenance, Male A healthy lifestyle and preventive care is important for your health and wellness. Ask your health care provider about what schedule of regular examinations is right for you. What should I know about weight and diet? Eat a Healthy Diet  Eat plenty of vegetables, fruits, whole grains, low-fat dairy products, and lean protein.  Do not eat a lot of foods high in solid fats, added sugars, or salt.  Maintain a Healthy Weight Regular exercise can help you achieve or maintain a healthy weight. You should:  Do at least 150 minutes of exercise each week. The exercise should increase your heart rate and make you sweat (moderate-intensity exercise).  Do strength-training exercises at least twice a week.  Watch Your Levels of Cholesterol and Blood Lipids  Have your blood tested for lipids and cholesterol every 5 years starting at 50 years of age. If you are at high risk for heart disease, you should start having your blood tested when you are 50 years old. You may need to have your cholesterol levels checked more often if: ? Your lipid or cholesterol levels are high. ? You are older than 50 years of age. ? You are at high risk for heart disease.  What should I know about cancer screening? Many types of cancers can be detected early and may often be prevented. Lung Cancer  You should be screened every year for lung cancer if: ? You are a current smoker who has smoked for at least 30 years. ? You are a former smoker who has quit within the past 15 years.  Talk to your health care provider about your screening options, when you should start screening, and how often you should be screened.  Colorectal Cancer  Routine colorectal cancer screening usually begins at 50 years of age and should be repeated every 5-10 years until you are 50 years old. You may need to be screened more often if early forms of precancerous polyps or small growths are found. Your health care provider  may recommend screening at an earlier age if you have risk factors for colon cancer.  Your health care provider may recommend using home test kits to check for hidden blood in the stool.  A small camera at the end of a tube can be used to examine your colon (sigmoidoscopy or colonoscopy). This checks for the earliest forms of colorectal cancer.  Prostate and Testicular Cancer  Depending on your age and overall health, your health care provider may do certain tests to screen for prostate and testicular cancer.  Talk to your health care provider about any symptoms or concerns you have about testicular or prostate cancer.  Skin Cancer  Check your skin from head to toe regularly.  Tell your health care provider about any new moles or changes in moles, especially if: ? There is a change in a mole's size, shape, or color. ? You have a mole that is larger than a pencil eraser.  Always use sunscreen. Apply sunscreen liberally and repeat throughout the day.  Protect yourself by wearing long sleeves, pants, a wide-brimmed hat, and sunglasses when outside.  What should I know about heart disease, diabetes, and high blood pressure?  If you are 18-39 years of age, have your blood pressure checked every 3-5 years. If you are 40 years of age or older, have your blood pressure checked every year. You should have your blood pressure measured twice-once when you are at a hospital or clinic, and once when   you are not at a hospital or clinic. Record the average of the two measurements. To check your blood pressure when you are not at a hospital or clinic, you can use: ? An automated blood pressure machine at a pharmacy. ? A home blood pressure monitor.  Talk to your health care provider about your target blood pressure.  If you are between 3-74 years old, ask your health care provider if you should take aspirin to prevent heart disease.  Have regular diabetes screenings by checking your fasting blood  sugar level. ? If you are at a normal weight and have a low risk for diabetes, have this test once every three years after the age of 18. ? If you are overweight and have a high risk for diabetes, consider being tested at a younger age or more often.  A one-time screening for abdominal aortic aneurysm (AAA) by ultrasound is recommended for men aged 13-75 years who are current or former smokers. What should I know about preventing infection? Hepatitis B If you have a higher risk for hepatitis B, you should be screened for this virus. Talk with your health care provider to find out if you are at risk for hepatitis B infection. Hepatitis C Blood testing is recommended for:  Everyone born from [birth year] through 1964.  Anyone with known risk factors for hepatitis C.  Sexually Transmitted Diseases (STDs)  You should be screened each year for STDs including gonorrhea and chlamydia if: ? You are sexually active and are younger than 50 years of age. ? You are older than 50 years of age and your health care provider tells you that you are at risk for this type of infection. ? Your sexual activity has changed since you were last screened and you are at an increased risk for chlamydia or gonorrhea. Ask your health care provider if you are at risk.  Talk with your health care provider about whether you are at high risk of being infected with HIV. Your health care provider may recommend a prescription medicine to help prevent HIV infection.  What else can I do?  Schedule regular health, dental, and eye exams.  Stay current with your vaccines (immunizations).  Do not use any tobacco products, such as cigarettes, chewing tobacco, and e-cigarettes. If you need help quitting, ask your health care provider.  Limit alcohol intake to no more than 2 drinks per day. One drink equals 12 ounces of beer, 5 ounces of wine, or 1 ounces of hard liquor.  Do not use street drugs.  Do not share needles.  Ask your  health care provider for help if you need support or information about quitting drugs.  Tell your health care provider if you often feel depressed.  Tell your health care provider if you have ever been abused or do not feel safe at home. This information is not intended to replace advice given to you by your health care provider. Make sure you discuss any questions you have with your health care provider. Document Released: 08/05/2007 Document Revised: 10/06/2015 Document Reviewed: 11/10/2014 Elsevier Interactive Patient Education  2018 [employer]. Tdap Vaccine (Tetanus, Diphtheria and Pertussis): What You Need to Know 1. Why get vaccinated? Tetanus, diphtheria and pertussis are very serious diseases. Tdap vaccine can protect [place] from these diseases. And, Tdap vaccine given to pregnant women can protect newborn babies against pertussis. TETANUS (Lockjaw) is rare in the [place] today. It causes painful muscle tightening and stiffness, usually all over the body.  It can lead to tightening of muscles in the head and neck so you can't open your mouth, swallow, or sometimes even breathe. Tetanus kills about 1 out of 10 people who are infected even after receiving the best medical care.  DIPHTHERIA is also rare in the [place] today. It can cause a thick coating to form in the back of the throat.  It can lead to breathing problems, heart failure, paralysis, and death.  PERTUSSIS (Whooping Cough) causes severe coughing spells, which can cause difficulty breathing, vomiting and disturbed sleep.  It can also lead to weight loss, incontinence, and rib fractures. Up to 2 in 100 adolescents and 5 in 100 adults with pertussis are hospitalized or have complications, which could include pneumonia or death.  These diseases are caused by bacteria. Diphtheria and pertussis are spread from person to person through secretions from coughing or sneezing. Tetanus enters the body through cuts, scratches,  or wounds. Before vaccines, as many as 200,000 cases of diphtheria, 200,000 cases of pertussis, and hundreds of cases of tetanus, were reported in the [place] each year. Since vaccination began, reports of cases for tetanus and diphtheria have dropped by about 99% and for pertussis by about 80%. 2. Tdap vaccine Tdap vaccine can protect adolescents and adults from tetanus, diphtheria, and pertussis. One dose of Tdap is routinely given at age 35 or 13. People who did not get Tdap at that age should get it as soon as possible. Tdap is especially important for healthcare professionals and anyone having close contact with a baby younger than 12 months. Pregnant women should get a dose of Tdap during every pregnancy, to protect the newborn from pertussis. Infants are most at risk for severe, life-threatening complications from pertussis. Another vaccine, called Td, protects against tetanus and diphtheria, but not pertussis. A Td booster should be given every 10 years. Tdap may be given as one of these boosters if you have never gotten Tdap before. Tdap may also be given after a severe cut or burn to prevent tetanus infection. Your doctor or the person giving you the vaccine can give you more information. Tdap may safely be given at the same time as other vaccines. 3. Some people should not get this vaccine  A person who has ever had a life-threatening allergic reaction after a previous dose of any diphtheria, tetanus or pertussis containing vaccine, OR has a severe allergy to any part of this vaccine, should not get Tdap vaccine. Tell the person giving the vaccine about any severe allergies.  Anyone who had coma or long repeated seizures within 7 days after a childhood dose of DTP or DTaP, or a previous dose of Tdap, should not get Tdap, unless a cause other than the vaccine was found. They can still get Td.  Talk to your doctor if you: ? have seizures or another nervous system problem, ? had severe  pain or swelling after any vaccine containing diphtheria, tetanus or pertussis, ? ever had a condition called Guillain-Barr Syndrome (GBS), ? aren't feeling well on the day the shot is scheduled. 4. Risks With any medicine, including vaccines, there is a chance of side effects. These are usually mild and go away on their own. Serious reactions are also possible but are rare. Most people who get Tdap vaccine do not have any problems with it. Mild problems following Tdap: (Did not interfere with activities)  Pain where the shot was given (about 3 in 4 adolescents or 2 in 3 adults)  Redness or swelling where the shot was given (about 1 person in 5)  Mild fever of at least 100.45F (up to about 1 in 25 adolescents or 1 in 100 adults)  Headache (about 3 or 4 people in 10)  Tiredness (about 1 person in 3 or 4)  Nausea, vomiting, diarrhea, stomach ache (up to 1 in 4 adolescents or 1 in 10 adults)  Chills, sore joints (about 1 person in 10)  Body aches (about 1 person in 3 or 4)  Rash, swollen glands (uncommon)  Moderate problems following Tdap: (Interfered with activities, but did not require medical attention)  Pain where the shot was given (up to 1 in 5 or 6)  Redness or swelling where the shot was given (up to about 1 in 16 adolescents or 1 in 12 adults)  Fever over 102F (about 1 in 100 adolescents or 1 in 250 adults)  Headache (about 1 in 7 adolescents or 1 in 10 adults)  Nausea, vomiting, diarrhea, stomach ache (up to 1 or 3 people in 100)  Swelling of the entire arm where the shot was given (up to about 1 in 500).  Severe problems following Tdap: (Unable to perform usual activities; required medical attention)  Swelling, severe pain, bleeding and redness in the arm where the shot was given (rare).  Problems that could happen after any vaccine:  People sometimes faint after a medical procedure, including vaccination. Sitting or lying down for about 15 minutes can help  prevent fainting, and injuries caused by a fall. Tell your doctor if you feel dizzy, or have vision changes or ringing in the ears.  Some people get severe pain in the shoulder and have difficulty moving the arm where a shot was given. This happens very rarely.  Any medication can cause a severe allergic reaction. Such reactions from a vaccine are very rare, estimated at fewer than 1 in a million doses, and would happen within a few minutes to a few hours after the vaccination. As with any medicine, there is a very remote chance of a vaccine causing a serious injury or death. The safety of vaccines is always being monitored. For more information, visit: http://www.aguilar.org/ 5. What if there is a serious problem? What should I look for? Look for anything that concerns you, such as signs of a severe allergic reaction, very high fever, or unusual behavior. Signs of a severe allergic reaction can include hives, swelling of the face and throat, difficulty breathing, a fast heartbeat, dizziness, and weakness. These would usually start a few minutes to a few hours after the vaccination. What should I do?  If you think it is a severe allergic reaction or other emergency that can't wait, call 9-1-1 or get the person to the nearest hospital. Otherwise, call your doctor.  Afterward, the reaction should be reported to the Vaccine Adverse Event Reporting System (VAERS). Your doctor might file this report, or you can do it yourself through the VAERS web site at www.vaers.SamedayNews.es, or by calling [phone]. ? VAERS does not give medical advice. 6. The National Vaccine Injury Compensation Program The [employer] Vaccine Injury Compensation Program (VICP) is a federal program that was created to compensate people who may have been injured by certain vaccines. Persons who believe they may have been injured by a vaccine can learn about the program and about filing a claim by calling 865-552-8675 or visiting  the [place] website at GoldCloset.com.ee. There is a time limit to file a claim for compensation. 7.  How can I learn more?  Ask your doctor. He or she can give you the vaccine package insert or suggest other sources of information.  Call your local or state health department.  Contact the Centers for Disease Control and Prevention (CDC): ? Call 1-800-232-4636 (1-800-CDC-INFO) or ? Visit CDC's website at www.cdc.gov/vaccines CDC Tdap Vaccine VIS (04/15/13) This information is not intended to replace advice given to you by your health care provider. Make sure you discuss any questions you have with your health care provider. Document Released: 08/08/2011 Document Revised: 10/28/2015 Document Reviewed: 10/28/2015 Elsevier Interactive Patient Education  2017 [employer] Inc.  

## 2016-09-28 NOTE — Assessment & Plan Note (Signed)
New onset. Not feeling good. Will check labs for organic cause, if all negative, will start SSRI. Await results.

## 2016-09-28 NOTE — Progress Notes (Signed)
BP 132/85 (BP Location: Left Arm, Patient Position: Sitting, Cuff Size: Normal)   Pulse 72   Temp 98.4 F (36.9 C)   Wt 253 lb 1 oz (114.8 kg)   SpO2 97%   BMI 38.14 kg/m    Subjective:    Patient ID: Eric Blevins, male    DOB: [birth year], 51 y.o.   MRN: [id number]  HPI: Eric Blevins is a 51 y.o. male presenting on 09/28/2016 for comprehensive medical examination. Current medical complaints include:  ARTHRALGIAS / JOINT ACHES Duration: about a month Pain: yes Symmetric: no Severity: 5/10 Quality: aching with movement Frequency: intermittent Context:  worse and fluctuating Decreased function/range of motion: no  Erythema: no Swelling: no Heat or warmth: no Morning stiffness: yes Aggravating factors: movement Alleviating factors: nothing Relief with NSAIDs?: no Treatments attempted:  rest, APAP, ibuprofen and aleve  Involved Joints:     Hands: yes right    Wrists: yes left     Elbows: no     Shoulders: no     Back: no     Hips: no     Knees: yes right    Ankles: yes bilateral    Feet: no   HYPERTENSION / HYPERLIPIDEMIA Satisfied with current treatment? yes Duration of hypertension: chronic BP monitoring frequency: not checking BP medication side effects: no Past BP meds: lisinopril, amlodipine Duration of hyperlipidemia: chronic Cholesterol medication side effects: Not on anything Cholesterol supplements: none Past cholesterol medications: none Medication compliance: excellent compliance Aspirin: no Recent stressors: yes- had a panic attack about 2 weeks ago for about 10 minutes Recurrent headaches: no Visual changes: no Palpitations: yes Dyspnea: no Chest pain: no Lower extremity edema: no Dizzy/lightheaded: no  He currently lives with: wife Interim Problems from his last visit: yes  Depression Screen done today and results listed below:  Depression screen [facility] 2/9 09/28/2016 09/28/2015  Decreased Interest 3 0  Down, Depressed, Hopeless 3 0    PHQ - 2 Score 6 0  Altered sleeping 0 -  Tired, decreased energy 3 -  Change in appetite 2 -  Feeling bad or failure about yourself  0 -  Trouble concentrating 2 -  Moving slowly or fidgety/restless 0 -  Suicidal thoughts 0 -  PHQ-9 Score 13 -    Past Medical History:  Past Medical History:  Diagnosis Date  . Allergy   . Benign hypertensive renal disease   . Chronic kidney disease, stage I   . ED (erectile dysfunction)   . Hyperlipidemia   . Hypertension   . Hypogonadism in male   . Obesity   . Sleep apnea   . Thrombocytosis ([place])     Surgical History:  Past Surgical History:  Procedure Laterality Date  . LUMBAR DISC SURGERY  1990   l4/5     Medications:  Current Outpatient Prescriptions on File Prior to Visit  Medication Sig  . amLODipine (NORVASC) 5 MG tablet TAKE 1 TABLET (5 MG TOTAL) BY MOUTH DAILY.  Marland Kitchen lisinopril (PRINIVIL,ZESTRIL) 2.5 MG tablet TAKE 1 TABLET EVERY DAY   No current facility-administered medications on file prior to visit.     Allergies:  No Known Allergies  Social History:  Social History   Social History  . Marital status: Married    Spouse name: N/A  . Number of children: N/A  . Years of education: N/A   Occupational History  . Not on file.   Social History Main Topics  . Smoking status: Never Smoker  .  Smokeless tobacco: Never Used  . Alcohol use No  . Drug use: No  . Sexual activity: Yes    Birth control/ protection: Condom   Other Topics Concern  . Not on file   Social History Narrative  . No narrative on file   History  Smoking Status  . Never Smoker  Smokeless Tobacco  . Never Used   History  Alcohol Use No    Family History:  Family History  Problem Relation Age of Onset  . Hypertension Father   . Hyperlipidemia Father   . Diabetes Mother   . Cancer Maternal Grandmother   . Cancer Maternal Grandfather   . Diabetes Paternal Grandmother   . Diabetes Brother   . Thyroid disease Brother     Past  medical history, surgical history, medications, allergies, family history and social history reviewed with patient today and changes made to appropriate areas of the chart.   Review of Systems  Constitutional: Negative.   HENT: Negative.   Eyes: Negative.   Respiratory: Negative.   Cardiovascular: Positive for palpitations. Negative for chest pain, orthopnea, claudication, leg swelling and PND.  Gastrointestinal: Negative for abdominal pain, blood in stool, constipation, diarrhea, heartburn, melena, nausea and vomiting.  Genitourinary: Negative.   Musculoskeletal: Positive for joint pain. Negative for back pain, falls, myalgias and neck pain.  Skin: Negative.   Neurological: Negative.   Endo/Heme/Allergies: Positive for environmental allergies. Negative for polydipsia. Does not bruise/bleed easily.  Psychiatric/Behavioral: Positive for depression. Negative for hallucinations, memory loss, substance abuse and suicidal ideas. The patient is nervous/anxious. The patient does not have insomnia.     All other ROS negative except what is listed above and in the HPI.      Objective:    BP 132/85 (BP Location: Left Arm, Patient Position: Sitting, Cuff Size: Normal)   Pulse 72   Temp 98.4 F (36.9 C)   Wt 253 lb 1 oz (114.8 kg)   SpO2 97%   BMI 38.14 kg/m   Wt Readings from Last 3 Encounters:  09/28/16 253 lb 1 oz (114.8 kg)  09/28/15 254 lb (115.2 kg)  01/18/15 266 lb (120.7 kg)    Physical Exam  Constitutional: He is oriented to person, place, and time. He appears well-developed and well-nourished. No distress.  HENT:  Head: Normocephalic and atraumatic.  Right Ear: Hearing, tympanic membrane, external ear and ear canal normal.  Left Ear: Hearing, tympanic membrane, external ear and ear canal normal.  Nose: Nose normal.  Mouth/Throat: Uvula is midline, oropharynx is clear and moist and mucous membranes are normal. No oropharyngeal exudate.  Eyes: Pupils are equal, round, and  reactive to light. Conjunctivae, EOM and lids are normal. Right eye exhibits no discharge. Left eye exhibits no discharge. No scleral icterus.  Neck: Normal range of motion. Neck supple. No JVD present. No tracheal deviation present. No thyromegaly present.  Cardiovascular: Normal rate, regular rhythm, normal heart sounds and intact distal pulses.  Exam reveals no gallop and no friction rub.   No murmur heard. Pulmonary/Chest: Effort normal and breath sounds normal. No stridor. No respiratory distress. He has no wheezes. He has no rales. He exhibits no tenderness.  Abdominal: Soft. Bowel sounds are normal. He exhibits no distension and no mass. There is no tenderness. There is no rebound and no guarding. Hernia confirmed negative in the right inguinal area and confirmed negative in the left inguinal area.  Genitourinary: Rectum normal, prostate normal, testes normal and penis normal. Prostate is not enlarged  and not tender. Cremasteric reflex is present. Right testis shows no mass, no swelling and no tenderness. Right testis is descended. Cremasteric reflex is not absent on the right side. Left testis shows no mass, no swelling and no tenderness. Left testis is descended. Cremasteric reflex is not absent on the left side. Circumcised. No phimosis, paraphimosis, hypospadias, penile erythema or penile tenderness. No discharge found.  Musculoskeletal: Normal range of motion. He exhibits no edema, tenderness or deformity.  Lymphadenopathy:    He has no cervical adenopathy.  Neurological: He is alert and oriented to person, place, and time. He has normal reflexes. He displays normal reflexes. No cranial nerve deficit. He exhibits normal muscle tone. Coordination normal.  Skin: Skin is warm, dry and intact. No rash noted. He is not diaphoretic. No erythema. No pallor.  Psychiatric: His speech is normal and behavior is normal. Judgment and thought content normal. His mood appears not anxious. His affect is not  angry, not blunt and not inappropriate. Cognition and memory are normal. He exhibits a depressed mood.  Nursing note and vitals reviewed.   Results for orders placed or performed in visit on 09/28/16  Microscopic Examination  Result Value Ref Range   WBC, UA 0-5 0 - 5 /hpf   RBC, UA None seen 0 - 2 /hpf   Epithelial Cells (non renal) CANCELED    Bacteria, UA Moderate (A) None seen/Few  Urine Culture, Reflex  Result Value Ref Range   Urine Culture, Routine WILL FOLLOW   UA/M w/rflx Culture, Routine  Result Value Ref Range   Specific Gravity, UA 1.015 1.005 - 1.030   pH, UA 7.0 5.0 - 7.5   Color, UA Yellow Yellow   Appearance Ur Clear Clear   Leukocytes, UA Trace (A) Negative   Protein, UA Negative Negative/Trace   Glucose, UA Negative Negative   Ketones, UA Negative Negative   RBC, UA Negative Negative   Bilirubin, UA Negative Negative   Urobilinogen, Ur 0.2 0.2 - 1.0 mg/dL   Nitrite, UA Negative Negative   Microscopic Examination See below:    Urinalysis Reflex Comment       Assessment & Plan:   Problem List Items Addressed This Visit      Endocrine   Hypogonadism in male    Possibly contributing to current symptoms. Labs checked again today. Await results.       Relevant Orders   CBC with Differential/Platelet   Comprehensive metabolic panel   Testosterone, free, total     Genitourinary   Benign hypertensive renal disease    Under good control. Refills given today. Call with any concerns.       Relevant Orders   CBC with Differential/Platelet   Comprehensive metabolic panel   Hgb I6N w/o eAG   Microalbumin, Urine Waived   UA/M w/rflx Culture, Routine (Completed)   Chronic kidney disease, stage I    Checking labs today. Await results.       Relevant Orders   CBC with Differential/Platelet   Comprehensive metabolic panel     Other   Hyperlipidemia    Checking labs today. Await results.       Relevant Orders   CBC with Differential/Platelet    Comprehensive metabolic panel   Depression, major, single episode, moderate ([place])    New onset. Not feeling good. Will check labs for organic cause, if all negative, will start SSRI. Await results.       Relevant Orders   CBC with Differential/Platelet   Hgb  A1c w/o eAG   TSH    Other Visit Diagnoses    Routine general medical examination at a health care facility    -  Primary   Vaccines updated. Screening labs checked today. Will hold on colonoscopy until next visit. Continue diet and exercise. Call with any concerns.    Relevant Orders   CBC with Differential/Platelet   Comprehensive metabolic panel   Hgb B7S w/o eAG   Lipid Panel w/o Chol/HDL Ratio   TSH   UA/M w/rflx Culture, Routine (Completed)   Immunization due       Tdap given today.   Relevant Orders   Tdap vaccine greater than or equal to 7yo IM (Completed)   CBC with Differential/Platelet   Arthralgia, unspecified joint       New onset- will check labs. Await results. Call with any concerns.    Relevant Orders   CBC with Differential/Platelet   Hgb A1c w/o eAG   Lyme Ab/Western Blot Reflex   Ehrlichia Antibody Panel   Babesia microti Antibody Panel   Rocky mtn spotted fvr abs pnl(IgG+IgM)   Uric acid   VITAMIN D 25 Hydroxy (Vit-D Deficiency, Fractures)   RA Qn+CCP(IgG/A)+SjoSSA+SjoSSB   Antinuclear Antib (ANA)   Sed Rate (ESR)   Fatigue, unspecified type       Checking labs today. Await results.    Relevant Orders   CBC with Differential/Platelet   Hgb A1c w/o eAG   Lyme Ab/Western Blot Reflex   Ehrlichia Antibody Panel   Babesia microti Antibody Panel   Rocky mtn spotted fvr abs pnl(IgG+IgM)   Uric acid   VITAMIN D 25 Hydroxy (Vit-D Deficiency, Fractures)   RA Qn+CCP(IgG/A)+SjoSSA+SjoSSB   Antinuclear Antib (ANA)   Sed Rate (ESR)   Screening for prostate cancer       Checking labs today. Await results.    Relevant Orders   PSA      LABORATORY TESTING:  Health maintenance labs ordered today as  discussed above.   The natural history of prostate cancer and ongoing controversy regarding screening and potential treatment outcomes of prostate cancer has been discussed with the patient. The meaning of a false positive PSA and a false negative PSA has been discussed. He indicates understanding of the limitations of this screening test and wishes to proceed with screening PSA testing.   IMMUNIZATIONS:   - Tdap: Tetanus vaccination status reviewed: Tdap vaccination indicated and given today. - Influenza: Postponed to flu season - Pneumovax: Not applicable - Prevnar: Not applicable - Zostavax vaccine: Will check with his insurance  SCREENING: - Colonoscopy: Refused  Discussed with patient purpose of the colonoscopy is to detect colon cancer at curable precancerous or early stages   PATIENT COUNSELING:    Sexuality: Discussed sexually transmitted diseases, partner selection, use of condoms, avoidance of unintended pregnancy  and contraceptive alternatives.   Advised to avoid cigarette smoking.  I discussed with the patient that most people either abstain from alcohol or drink within safe limits (<=14/week and <=4 drinks/occasion for males, <=7/weeks and <= 3 drinks/occasion for females) and that the risk for alcohol disorders and other health effects rises proportionally with the number of drinks per week and how often a drinker exceeds daily limits.  Discussed cessation/primary prevention of drug use and availability of treatment for abuse.   Diet: Encouraged to adjust caloric intake to maintain  or achieve ideal body weight, to reduce intake of dietary saturated fat and total fat, to limit sodium intake by avoiding high  sodium foods and not adding table salt, and to maintain adequate dietary potassium and calcium preferably from fresh fruits, vegetables, and low-fat dairy products.    stressed the importance of regular exercise  Injury prevention: Discussed safety belts, safety  helmets, smoke detector, smoking near bedding or upholstery.   Dental health: Discussed importance of regular tooth brushing, flossing, and dental visits.   Follow up plan: NEXT PREVENTATIVE PHYSICAL DUE IN 1 YEAR. Return in about 4 weeks (around 10/26/2016) for Follow up joints and mood.

## 2016-09-30 LAB — TESTOSTERONE, FREE, TOTAL, SHBG
Sex Hormone Binding: 26.2 nmol/L (ref 19.3–76.4)
TESTOSTERONE FREE: 6.9 pg/mL — AB (ref 7.2–24.0)
TESTOSTERONE: 189 ng/dL — AB (ref 264–916)

## 2016-09-30 LAB — COMPREHENSIVE METABOLIC PANEL
ALK PHOS: 84 IU/L (ref 39–117)
ALT: 23 IU/L (ref 0–44)
AST: 23 IU/L (ref 0–40)
Albumin/Globulin Ratio: 1.3 (ref 1.2–2.2)
Albumin: 4.3 g/dL (ref 3.5–5.5)
BUN/Creatinine Ratio: 11 (ref 9–20)
BUN: 12 mg/dL (ref 6–24)
Bilirubin Total: 0.3 mg/dL (ref 0.0–1.2)
CALCIUM: 9.7 mg/dL (ref 8.7–10.2)
CO2: 24 mmol/L (ref 20–29)
CREATININE: 1.12 mg/dL (ref 0.76–1.27)
Chloride: 99 mmol/L (ref 96–106)
GFR calc Af Amer: 88 mL/min/{1.73_m2} (ref >59)
GFR, EST NON AFRICAN AMERICAN: 76 mL/min/{1.73_m2} (ref >59)
GLUCOSE: 87 mg/dL (ref 65–99)
Globulin, Total: 3.4 g/dL (ref 1.5–4.5)
Potassium: 4.3 mmol/L (ref 3.5–5.2)
SODIUM: 138 mmol/L (ref 134–144)
Total Protein: 7.7 g/dL (ref 6.0–8.5)

## 2016-09-30 LAB — BABESIA MICROTI ANTIBODY PANEL: Babesia microti IgM: <1:10 {titer}

## 2016-09-30 LAB — CBC WITH DIFFERENTIAL/PLATELET
BASOS ABS: 0 10*3/uL (ref 0.0–0.2)
Basos: 0 %
EOS (ABSOLUTE): 0.2 10*3/uL (ref 0.0–0.4)
EOS: 4 %
Hematocrit: 46.4 % (ref 37.5–51.0)
Hemoglobin: 15.9 g/dL (ref 13.0–17.7)
Immature Grans (Abs): 0 10*3/uL (ref 0.0–0.1)
Immature Granulocytes: 0 %
LYMPHS ABS: 2.9 10*3/uL (ref 0.7–3.1)
Lymphs: 47 %
MCH: 30.9 pg (ref 26.6–33.0)
MCHC: 34.3 g/dL (ref 31.5–35.7)
MCV: 90 fL (ref 79–97)
MONOS ABS: 0.4 10*3/uL (ref 0.1–0.9)
Monocytes: 7 %
NEUTROS PCT: 42 %
Neutrophils Absolute: 2.6 10*3/uL (ref 1.4–7.0)
PLATELETS: 383 10*3/uL — AB (ref 150–379)
RBC: 5.15 x10E6/uL (ref 4.14–5.80)
RDW: 13.7 % (ref 12.3–15.4)
WBC: 6.3 10*3/uL (ref 3.4–10.8)

## 2016-09-30 LAB — UA/M W/RFLX CULTURE, ROUTINE
Bilirubin, UA: NEGATIVE
GLUCOSE, UA: NEGATIVE
KETONES UA: NEGATIVE
NITRITE UA: NEGATIVE
Protein, UA: NEGATIVE
RBC, UA: NEGATIVE
SPEC GRAV UA: 1.015 (ref 1.005–1.030)
Urobilinogen, Ur: 0.2 mg/dL (ref 0.2–1.0)
pH, UA: 7 (ref 5.0–7.5)

## 2016-09-30 LAB — LYME AB/WESTERN BLOT REFLEX: LYME DISEASE AB, QUANT, IGM: <0.8 index (ref 0.00–0.79)

## 2016-09-30 LAB — URINE CULTURE, REFLEX: ORGANISM ID, BACTERIA: NO GROWTH

## 2016-09-30 LAB — LIPID PANEL W/O CHOL/HDL RATIO
CHOLESTEROL TOTAL: 232 mg/dL — AB (ref 100–199)
HDL: 52 mg/dL (ref >39)
LDL Calculated: 153 mg/dL — ABNORMAL HIGH (ref 0–99)
TRIGLYCERIDES: 133 mg/dL (ref 0–149)
VLDL CHOLESTEROL CAL: 27 mg/dL (ref 5–40)

## 2016-09-30 LAB — PSA: Prostate Specific Ag, Serum: 0.8 ng/mL (ref 0.0–4.0)

## 2016-09-30 LAB — RA QN+CCP(IGG/A)+SJOSSA+SJOSSB
CYCLIC CITRULLIN PEPTIDE AB: 8 U (ref 0–19)
ENA SSA (RO) Ab: <0.2 AI (ref 0.0–0.9)
ENA SSB (LA) Ab: <0.2 AI (ref 0.0–0.9)
Rhuematoid fact SerPl-aCnc: <10 IU/mL (ref 0.0–13.9)

## 2016-09-30 LAB — SEDIMENTATION RATE: Sed Rate: 6 mm/hr (ref 0–30)

## 2016-09-30 LAB — ROCKY MTN SPOTTED FVR ABS PNL(IGG+IGM)
RMSF IGG: NEGATIVE
RMSF IgM: 0.21 index (ref 0.00–0.89)

## 2016-09-30 LAB — ANA: Anti Nuclear Antibody(ANA): NEGATIVE

## 2016-09-30 LAB — VITAMIN D 25 HYDROXY (VIT D DEFICIENCY, FRACTURES): VIT D 25 HYDROXY: 25.7 ng/mL — AB (ref 30.0–100.0)

## 2016-09-30 LAB — URIC ACID: Uric Acid: 8.1 mg/dL (ref 3.7–8.6)

## 2016-09-30 LAB — TSH: TSH: 1.57 u[IU]/mL (ref 0.450–4.500)

## 2016-09-30 LAB — HGB A1C W/O EAG: Hgb A1c MFr Bld: 5.9 % — ABNORMAL HIGH (ref 4.8–5.6)

## 2016-10-02 LAB — EHRLICHIA ANTIBODY PANEL
E. CHAFFEENSIS (HME) IGM TITER: NEGATIVE
E. CHAFFEENSIS IGG AB: NEGATIVE
HGE IGG TITER: NEGATIVE
HGE IGM TITER: NEGATIVE

## 2016-10-03 ENCOUNTER — Telehealth: Payer: Self-pay | Admitting: Family Medicine

## 2016-10-03 NOTE — Telephone Encounter (Signed)
Patient notified of his results. 

## 2016-10-03 NOTE — Telephone Encounter (Signed)
Patient would like to know recent lab results from 09/28/2016.  Please Advise.  [employer]

## 2016-10-03 NOTE — Telephone Encounter (Signed)
Tried to call patient, no answer, unable to leave a message. 

## 2016-10-04 ENCOUNTER — Telehealth: Payer: Self-pay | Admitting: Family Medicine

## 2016-10-04 MED ORDER — SERTRALINE HCL 50 MG PO TABS: ORAL_TABLET | ORAL | 3 refills | Status: DC

## 2016-10-04 NOTE — Telephone Encounter (Signed)
Graciella Belton with his results. Besides low vitamin D, testosterone and elevated cholesterol everything was normal. Would like to try an antidepressant. Rx sent to his pharmacy. Keep appointment 10/26/16

## 2016-10-05 ENCOUNTER — Other Ambulatory Visit: Payer: Self-pay | Admitting: Family Medicine

## 2016-10-26 ENCOUNTER — Ambulatory Visit: Payer: BLUE CROSS/BLUE SHIELD | Admitting: Family Medicine

## 2016-11-02 ENCOUNTER — Ambulatory Visit: Payer: BLUE CROSS/BLUE SHIELD | Admitting: Family Medicine

## 2016-12-04 ENCOUNTER — Ambulatory Visit: Payer: Self-pay | Admitting: Family Medicine

## 2016-12-06 ENCOUNTER — Telehealth: Payer: Self-pay | Admitting: Family Medicine

## 2016-12-06 NOTE — Telephone Encounter (Signed)
Patient requesting refill on medication for sertraline to [facility] in [place], [place].  Please Advise.  Thank you

## 2016-12-07 NOTE — Telephone Encounter (Signed)
Patient notified that he should have a prescription at the pharmacy.

## 2016-12-13 ENCOUNTER — Encounter: Payer: Self-pay | Admitting: Family Medicine

## 2016-12-13 ENCOUNTER — Ambulatory Visit (INDEPENDENT_AMBULATORY_CARE_PROVIDER_SITE_OTHER): Payer: BLUE CROSS/BLUE SHIELD | Admitting: Family Medicine

## 2016-12-13 VITALS — BP 128/88 | HR 78 | Temp 97.3°F | Wt 262.1 lb

## 2016-12-13 DIAGNOSIS — F321 Major depressive disorder, single episode, moderate: Secondary | ICD-10-CM

## 2016-12-13 MED ORDER — SERTRALINE HCL 100 MG PO TABS: 100.0000 mg | ORAL_TABLET | Freq: Every day | ORAL | 1 refills | Status: DC

## 2016-12-13 NOTE — Assessment & Plan Note (Signed)
Better, but not quite there. Will increase his zoloft to 100mg  and check in by phone in 46month. Regular follow up in February.

## 2016-12-13 NOTE — Progress Notes (Addendum)
BP 128/88 (BP Location: Left Arm, Cuff Size: Normal)   Pulse 78   Temp (!) 97.3 F (36.3 C)   Wt 262 lb 2 oz (118.9 kg)   SpO2 97%   BMI 39.51 kg/m    Subjective:    Patient ID: Eric Blevins, male    DOB: 27-Mar-1965, 51 y.o.   MRN: [id number]  HPI: TARRENCE Blevins is a 51 y.o. male  Chief Complaint  Patient presents with  . Anxiety   ANXIETY/STRESS Duration:better Anxious mood: yes  Excessive worrying: yes Irritability: no  Sweating: no Nausea: no Palpitations:no Hyperventilation: no Panic attacks: no Agoraphobia: no  Obscessions/compulsions: no Depressed mood: yes Depression screen [facility] 2/9 09/28/2016 09/28/2015  Decreased Interest 3 0  Down, Depressed, Hopeless 3 0  PHQ - 2 Score 6 0  Altered sleeping 0 -  Tired, decreased energy 3 -  Change in appetite 2 -  Feeling bad or failure about yourself  0 -  Trouble concentrating 2 -  Moving slowly or fidgety/restless 0 -  Suicidal thoughts 0 -  PHQ-9 Score 13 -   GAD 7 : Generalized Anxiety Score 12/13/2016  Nervous, Anxious, on Edge 0  Control/stop worrying 1  Worry too much - different things 1  Trouble relaxing 0  Restless 0  Easily annoyed or irritable 0  Afraid - awful might happen 0  Total GAD 7 Score 2  Anxiety Difficulty Somewhat difficult    Anhedonia: no Weight changes: no Insomnia: no   Hypersomnia: no Fatigue/loss of energy: no Feelings of worthlessness: no Feelings of guilt: no Impaired concentration/indecisiveness: no Suicidal ideations: no  Crying spells: no Recent Stressors/Life Changes: yes   Relationship problems: no   Family stress: yes     Financial stress: no    Job stress: no    Recent death/loss: no  Relevant past medical, surgical, family and social history reviewed and updated as indicated. Interim medical history since our last visit reviewed. Allergies and medications reviewed and updated.  Review of Systems  Constitutional: Negative.   Respiratory: Negative.     Cardiovascular: Negative.   Psychiatric/Behavioral: Negative for agitation, behavioral problems, confusion, decreased concentration, dysphoric mood, hallucinations, self-injury, sleep disturbance and suicidal ideas. The patient is nervous/anxious. The patient is not hyperactive.     Per HPI unless specifically indicated above     Objective:    BP 128/88 (BP Location: Left Arm, Cuff Size: Normal)   Pulse 78   Temp (!) 97.3 F (36.3 C)   Wt 262 lb 2 oz (118.9 kg)   SpO2 97%   BMI 39.51 kg/m   Wt Readings from Last 3 Encounters:  12/13/16 262 lb 2 oz (118.9 kg)  09/28/16 253 lb 1 oz (114.8 kg)  09/28/15 254 lb (115.2 kg)    Physical Exam  Constitutional: He is oriented to person, place, and time. He appears well-developed and well-nourished. No distress.  HENT:  Head: Normocephalic and atraumatic.  Right Ear: Hearing normal.  Left Ear: Hearing normal.  Nose: Nose normal.  Eyes: Conjunctivae and lids are normal. Right eye exhibits no discharge. Left eye exhibits no discharge. No scleral icterus.  Cardiovascular: Normal rate, regular rhythm, normal heart sounds and intact distal pulses.  Exam reveals no gallop and no friction rub.   No murmur heard. Pulmonary/Chest: Effort normal and breath sounds normal. No respiratory distress. He has no wheezes. He has no rales. He exhibits no tenderness.  Musculoskeletal: Normal range of motion.  Neurological: He is alert  and oriented to person, place, and time.  Skin: Skin is warm, dry and intact. No rash noted. He is not diaphoretic. No erythema. No pallor.  Psychiatric: He has a normal mood and affect. His speech is normal and behavior is normal. Judgment and thought content normal. Cognition and memory are normal.    Results for orders placed or performed in visit on 09/28/16  Microscopic Examination  Result Value Ref Range   WBC, UA 0-5 0 - 5 /hpf   RBC, UA None seen 0 - 2 /hpf   Epithelial Cells (non renal) CANCELED    Bacteria, UA  Moderate (A) None seen/Few  Urine Culture, Reflex  Result Value Ref Range   Urine Culture, Routine Final report    Organism ID, Bacteria No growth   CBC with Differential/Platelet  Result Value Ref Range   WBC 6.3 3.4 - 10.8 x10E3/uL   RBC 5.15 4.14 - 5.80 x10E6/uL   Hemoglobin 15.9 13.0 - 17.7 g/dL   Hematocrit 46.4 37.5 - 51.0 %   MCV 90 79 - 97 fL   MCH 30.9 26.6 - 33.0 pg   MCHC 34.3 31.5 - 35.7 g/dL   RDW 13.7 12.3 - 15.4 %   Platelets 383 (H) 150 - 379 x10E3/uL   Neutrophils 42 Not Estab. %   Lymphs 47 Not Estab. %   Monocytes 7 Not Estab. %   Eos 4 Not Estab. %   Basos 0 Not Estab. %   Neutrophils Absolute 2.6 1.4 - 7.0 x10E3/uL   Lymphocytes Absolute 2.9 0.7 - 3.1 x10E3/uL   Monocytes Absolute 0.4 0.1 - 0.9 x10E3/uL   EOS (ABSOLUTE) 0.2 0.0 - 0.4 x10E3/uL   Basophils Absolute 0.0 0.0 - 0.2 x10E3/uL   Immature Granulocytes 0 Not Estab. %   Immature Grans (Abs) 0.0 0.0 - 0.1 x10E3/uL  Comprehensive metabolic panel  Result Value Ref Range   Glucose 87 65 - 99 mg/dL   BUN 12 6 - 24 mg/dL   Creatinine, Ser 1.12 0.76 - 1.27 mg/dL   GFR calc non Af Amer 76 >59 mL/min/1.73   GFR calc Af Amer 88 >59 mL/min/1.73   BUN/Creatinine Ratio 11 9 - 20   Sodium 138 134 - 144 mmol/L   Potassium 4.3 3.5 - 5.2 mmol/L   Chloride 99 96 - 106 mmol/L   CO2 24 20 - 29 mmol/L   Calcium 9.7 8.7 - 10.2 mg/dL   Total Protein 7.7 6.0 - 8.5 g/dL   Albumin 4.3 3.5 - 5.5 g/dL   Globulin, Total 3.4 1.5 - 4.5 g/dL   Albumin/Globulin Ratio 1.3 1.2 - 2.2   Bilirubin Total 0.3 0.0 - 1.2 mg/dL   Alkaline Phosphatase 84 39 - 117 IU/L   AST 23 0 - 40 IU/L   ALT 23 0 - 44 IU/L  Hgb A1c w/o eAG  Result Value Ref Range   Hgb A1c MFr Bld 5.9 (H) 4.8 - 5.6 %  Lipid Panel w/o Chol/HDL Ratio  Result Value Ref Range   Cholesterol, Total 232 (H) 100 - 199 mg/dL   Triglycerides 133 0 - 149 mg/dL   HDL 52 >39 mg/dL   VLDL Cholesterol Cal 27 5 - 40 mg/dL   LDL Calculated 153 (H) 0 - 99 mg/dL  PSA    Result Value Ref Range   Prostate Specific Ag, Serum 0.8 0.0 - 4.0 ng/mL  TSH  Result Value Ref Range   TSH 1.570 0.450 - 4.500 uIU/mL  UA/M w/rflx Culture,  Routine  Result Value Ref Range   Specific Gravity, UA 1.015 1.005 - 1.030   pH, UA 7.0 5.0 - 7.5   Color, UA Yellow Yellow   Appearance Ur Clear Clear   Leukocytes, UA Trace (A) Negative   Protein, UA Negative Negative/Trace   Glucose, UA Negative Negative   Ketones, UA Negative Negative   RBC, UA Negative Negative   Bilirubin, UA Negative Negative   Urobilinogen, Ur 0.2 0.2 - 1.0 mg/dL   Nitrite, UA Negative Negative   Microscopic Examination See below:    Urinalysis Reflex Comment   Lyme Ab/Western Blot Reflex  Result Value Ref Range   Lyme IgG/IgM Ab <0.91 0.00 - 0.90 ISR   LYME DISEASE AB, QUANT, IGM <0.80 0.00 - 7.84 index  Ehrlichia Antibody Panel  Result Value Ref Range   E.Chaffeensis (HME) IgG Negative Neg:<1:64   E. Chaffeensis (HME) IgM Titer Negative Neg:<1:20   HGE IgG Titer Negative Neg:<1:64   HGE IgM Titer Negative Neg:<1:20  Babesia microti Antibody Panel  Result Value Ref Range   Babesia microti IgM <1:10 Neg:<1:10   Babesia microti IgG <1:10 Neg:<1:10  Rocky mtn spotted fvr abs pnl(IgG+IgM)  Result Value Ref Range   RMSF IgG Negative Negative   RMSF IgM 0.21 0.00 - 0.89 index  Uric acid  Result Value Ref Range   Uric Acid 8.1 3.7 - 8.6 mg/dL  VITAMIN D 25 Hydroxy (Vit-D Deficiency, Fractures)  Result Value Ref Range   Vit D, 25-Hydroxy 25.7 (L) 30.0 - 100.0 ng/mL  RA Qn+CCP(IgG/A)+SjoSSA+SjoSSB  Result Value Ref Range   Rhuematoid fact SerPl-aCnc <10.0 0.0 - 13.9 IU/mL   ENA SSA (RO) Ab <0.2 0.0 - 0.9 AI   ENA SSB (LA) Ab <0.2 0.0 - 0.9 AI   Cyclic Citrullin Peptide Ab 8 0 - 19 units  Antinuclear Antib (ANA)  Result Value Ref Range   Anit Nuclear Antibody(ANA) Negative Negative  Sed Rate (ESR)  Result Value Ref Range   Sed Rate 6 0 - 30 mm/hr  Testosterone, free, total  Result Value  Ref Range   Testosterone 189 (L) 264 - 916 ng/dL   Testosterone, Free 6.9 (L) 7.2 - 24.0 pg/mL   Sex Hormone Binding 26.2 19.3 - 76.4 nmol/L      Assessment & Plan:   Problem List Items Addressed This Visit      Other   Depression, major, single episode, moderate ([facility]) - Primary    Better, but not quite there. Will increase his zoloft to 12m and check in by phone in 122monthRegular follow up in February.      Relevant Medications   sertraline (ZOLOFT) 100 MG tablet       Follow up plan: Return February, for 6 month follow up.

## 2017-01-11 ENCOUNTER — Other Ambulatory Visit: Payer: Self-pay | Admitting: Family Medicine

## 2017-01-16 ENCOUNTER — Telehealth: Payer: Self-pay | Admitting: Family Medicine

## 2017-01-16 NOTE — Telephone Encounter (Signed)
-----   Message from Valerie Roys, DO sent at 12/13/2016  8:39 AM EDT ----- Call about his mood

## 2017-01-16 NOTE — Telephone Encounter (Signed)
He states he's doing great on the higher dose. No funny side effects. Feeling well. No other concerns or complaints at this time.

## 2017-04-10 ENCOUNTER — Other Ambulatory Visit: Payer: Self-pay | Admitting: Family Medicine

## 2017-04-11 ENCOUNTER — Ambulatory Visit: Payer: Self-pay | Admitting: *Deleted

## 2017-04-11 NOTE — Telephone Encounter (Signed)
Pts   Wife  Was  Diagnosed   With  The  Flu  Today.   Pt denies   Any  Symptoms  At this  Time.  Pt  Is  Requesting a  Prescription  For  Tamiflu .  Pts  Phone number is   [phone]  [phone]  Pharmacy of  Choice is  [facility]  On  [address]

## 2017-04-12 MED ORDER — OSELTAMIVIR PHOSPHATE 75 MG PO CAPS: 75.0000 mg | ORAL_CAPSULE | Freq: Every day | ORAL | 0 refills | Status: DC

## 2017-04-12 NOTE — Telephone Encounter (Signed)
Patient notified

## 2017-04-12 NOTE — Addendum Note (Signed)
Addended by: Valerie Roys on: 04/12/2017 09:07 AM   Modules accepted: Orders

## 2017-04-12 NOTE — Telephone Encounter (Signed)
Tamiflu sent to his pharmacy

## 2017-06-11 ENCOUNTER — Other Ambulatory Visit: Payer: Self-pay | Admitting: Family Medicine

## 2017-06-13 ENCOUNTER — Ambulatory Visit: Payer: BLUE CROSS/BLUE SHIELD | Admitting: Family Medicine

## 2017-06-13 NOTE — Progress Notes (Deleted)
There were no vitals taken for this visit.   Subjective:    Patient ID: Eric Blevins, male    DOB: 11-01-1965, 52 y.o.   MRN: [id number]  HPI: Eric Blevins is a 52 y.o. male  No chief complaint on file.  HYPERTENSION / HYPERLIPIDEMIA Satisfied with current treatment? {Blank single:19197::"yes","no"} Duration of hypertension: {Blank single:19197::"chronic","months","years"} BP monitoring frequency: {Blank single:19197::"not checking","rarely","daily","weekly","monthly","a few times a day","a few times a week","a few times a month"} BP range:  BP medication side effects: {Blank single:19197::"yes","no"} Past BP meds: {Blank BLTJQZES:92330::"QTMA","UQJFHLKTGY","BWLSLHTDSK/AJGOTLXBWI","OMBTDHRC","BULAGTXMIW","OEHOZYYQMG/NOIB","BCWUGQBVQX (bystolic)","carvedilol","chlorthalidone","clonidine","diltiazem","exforge HCT","HCTZ","irbesartan (avapro)","labetalol","lisinopril","lisinopril-HCTZ","losartan (cozaar)","methyldopa","nifedipine","olmesartan (benicar)","olmesartan-HCTZ","quinapril","ramipril","spironalactone","tekturna","valsartan","valsartan-HCTZ","verapamil"} Duration of hyperlipidemia: {Blank single:19197::"chronic","months","years"} Cholesterol medication side effects: {Blank single:19197::"yes","no"} Cholesterol supplements: {Blank multiple:19196::"none","fish oil","niacin","red yeast rice"} Past cholesterol medications: {Blank multiple:19196::"none","atorvastain (lipitor)","lovastatin (mevacor)","pravastatin (pravachol)","rosuvastatin (crestor)","simvastatin (zocor)","vytorin","fenofibrate (tricor)","gemfibrozil","ezetimide (zetia)","niaspan","lovaza"} Medication compliance: {Blank single:19197::"excellent compliance","good compliance","fair compliance","poor compliance"} Aspirin: {Blank single:19197::"yes","no"} Recent stressors: {Blank single:19197::"yes","no"} Recurrent headaches: {Blank single:19197::"yes","no"} Visual changes: {Blank  single:19197::"yes","no"} Palpitations: {Blank single:19197::"yes","no"} Dyspnea: {Blank single:19197::"yes","no"} Chest pain: {Blank single:19197::"yes","no"} Lower extremity edema: {Blank single:19197::"yes","no"} Dizzy/lightheaded: {Blank single:19197::"yes","no"}  LOW TESTOSTERONE Duration: {Blank single:19197::"chronic","months","years"} Status: {Blank single:19197::"controlled","uncontrolled","better","worse","exacerbated","stable"}  Satisfied with current treatment:  {Blank single:19197::"yes","no"} Previous testosterone therapies: Medication side effects:  {Blank single:19197::"yes","no"} Medication compliance: {Blank single:19197::"excellent compliance","good compliance","fair compliance","poor compliance"} Decreased libido: {Blank single:19197::"yes","no"} Fatigue: {Blank single:19197::"yes","no"} Depressed mood: {Blank single:19197::"yes","no"} Muscle weakness: {Blank single:19197::"yes","no"} Erectile dysfunction: {Blank single:19197::"yes","no"}  Relevant past medical, surgical, family and social history reviewed and updated as indicated. Interim medical history since our last visit reviewed. Allergies and medications reviewed and updated.  Review of Systems  Per HPI unless specifically indicated above     Objective:    There were no vitals taken for this visit.  Wt Readings from Last 3 Encounters:  12/13/16 262 lb 2 oz (118.9 kg)  09/28/16 253 lb 1 oz (114.8 kg)  09/28/15 254 lb (115.2 kg)    Physical Exam  Results for orders placed or performed in visit on 09/28/16  Microscopic Examination  Result Value Ref Range   WBC, UA 0-5 0 - 5 /hpf   RBC, UA None seen 0 - 2 /hpf   Epithelial Cells (non renal) CANCELED    Bacteria, UA Moderate (A) None seen/Few  Urine Culture, Reflex  Result Value Ref Range   Urine Culture, Routine Final report    Organism ID, Bacteria No growth   CBC with Differential/Platelet  Result Value Ref Range   WBC 6.3 3.4 - 10.8  x10E3/uL   RBC 5.15 4.14 - 5.80 x10E6/uL   Hemoglobin 15.9 13.0 - 17.7 g/dL   Hematocrit 46.4 37.5 - 51.0 %   MCV 90 79 - 97 fL   MCH 30.9 26.6 - 33.0 pg   MCHC 34.3 31.5 - 35.7 g/dL   RDW 13.7 12.3 - 15.4 %   Platelets 383 (H) 150 - 379 x10E3/uL   Neutrophils 42 Not Estab. %   Lymphs 47 Not Estab. %   Monocytes 7 Not Estab. %   Eos 4 Not Estab. %   Basos 0 Not Estab. %   Neutrophils Absolute 2.6 1.4 - 7.0 x10E3/uL   Lymphocytes Absolute 2.9 0.7 - 3.1 x10E3/uL   Monocytes Absolute 0.4 0.1 - 0.9 x10E3/uL   EOS (ABSOLUTE) 0.2 0.0 - 0.4 x10E3/uL   Basophils Absolute 0.0 0.0 - 0.2 x10E3/uL   Immature Granulocytes 0 Not Estab. %   Immature Grans (Abs) 0.0 0.0 - 0.1 x10E3/uL  Comprehensive metabolic panel  Result Value Ref Range   Glucose 87 65 - 99 mg/dL   BUN 12 6 - 24 mg/dL  Creatinine, Ser 1.12 0.76 - 1.27 mg/dL   GFR calc non Af Amer 76 >59 mL/min/1.73   GFR calc Af Amer 88 >59 mL/min/1.73   BUN/Creatinine Ratio 11 9 - 20   Sodium 138 134 - 144 mmol/L   Potassium 4.3 3.5 - 5.2 mmol/L   Chloride 99 96 - 106 mmol/L   CO2 24 20 - 29 mmol/L   Calcium 9.7 8.7 - 10.2 mg/dL   Total Protein 7.7 6.0 - 8.5 g/dL   Albumin 4.3 3.5 - 5.5 g/dL   Globulin, Total 3.4 1.5 - 4.5 g/dL   Albumin/Globulin Ratio 1.3 1.2 - 2.2   Bilirubin Total 0.3 0.0 - 1.2 mg/dL   Alkaline Phosphatase 84 39 - 117 IU/L   AST 23 0 - 40 IU/L   ALT 23 0 - 44 IU/L  Hgb A1c w/o eAG  Result Value Ref Range   Hgb A1c MFr Bld 5.9 (H) 4.8 - 5.6 %  Lipid Panel w/o Chol/HDL Ratio  Result Value Ref Range   Cholesterol, Total 232 (H) 100 - 199 mg/dL   Triglycerides 133 0 - 149 mg/dL   HDL 52 >39 mg/dL   VLDL Cholesterol Cal 27 5 - 40 mg/dL   LDL Calculated 153 (H) 0 - 99 mg/dL  PSA  Result Value Ref Range   Prostate Specific Ag, Serum 0.8 0.0 - 4.0 ng/mL  TSH  Result Value Ref Range   TSH 1.570 0.450 - 4.500 uIU/mL  UA/M w/rflx Culture, Routine  Result Value Ref Range   Specific Gravity, UA 1.015 1.005 -  1.030   pH, UA 7.0 5.0 - 7.5   Color, UA Yellow Yellow   Appearance Ur Clear Clear   Leukocytes, UA Trace (A) Negative   Protein, UA Negative Negative/Trace   Glucose, UA Negative Negative   Ketones, UA Negative Negative   RBC, UA Negative Negative   Bilirubin, UA Negative Negative   Urobilinogen, Ur 0.2 0.2 - 1.0 mg/dL   Nitrite, UA Negative Negative   Microscopic Examination See below:    Urinalysis Reflex Comment   Lyme Ab/Western Blot Reflex  Result Value Ref Range   Lyme IgG/IgM Ab <0.91 0.00 - 0.90 ISR   LYME DISEASE AB, QUANT, IGM <0.80 0.00 - 2.67 index  Ehrlichia Antibody Panel  Result Value Ref Range   E.Chaffeensis (HME) IgG Negative Neg:<1:64   E. Chaffeensis (HME) IgM Titer Negative Neg:<1:20   HGE IgG Titer Negative Neg:<1:64   HGE IgM Titer Negative Neg:<1:20  Babesia microti Antibody Panel  Result Value Ref Range   Babesia microti IgM <1:10 Neg:<1:10   Babesia microti IgG <1:10 Neg:<1:10  Rocky mtn spotted fvr abs pnl(IgG+IgM)  Result Value Ref Range   RMSF IgG Negative Negative   RMSF IgM 0.21 0.00 - 0.89 index  Uric acid  Result Value Ref Range   Uric Acid 8.1 3.7 - 8.6 mg/dL  VITAMIN D 25 Hydroxy (Vit-D Deficiency, Fractures)  Result Value Ref Range   Vit D, 25-Hydroxy 25.7 (L) 30.0 - 100.0 ng/mL  RA Qn+CCP(IgG/A)+SjoSSA+SjoSSB  Result Value Ref Range   Rhuematoid fact SerPl-aCnc <10.0 0.0 - 13.9 IU/mL   ENA SSA (RO) Ab <0.2 0.0 - 0.9 AI   ENA SSB (LA) Ab <0.2 0.0 - 0.9 AI   Cyclic Citrullin Peptide Ab 8 0 - 19 units  Antinuclear Antib (ANA)  Result Value Ref Range   Anit Nuclear Antibody(ANA) Negative Negative  Sed Rate (ESR)  Result Value Ref Range   Sed Rate  6 0 - 30 mm/hr  Testosterone, free, total  Result Value Ref Range   Testosterone 189 (L) 264 - 916 ng/dL   Testosterone, Free 6.9 (L) 7.2 - 24.0 pg/mL   Sex Hormone Binding 26.2 19.3 - 76.4 nmol/L      Assessment & Plan:   Problem List Items Addressed This Visit      Endocrine    Hypogonadism in male     Genitourinary   Benign hypertensive renal disease - Primary   Chronic kidney disease, stage I     Other   Hyperlipidemia   Depression, major, single episode, moderate (HCC)       Follow up plan: No follow-ups on file.

## 2017-07-05 ENCOUNTER — Other Ambulatory Visit: Payer: Self-pay | Admitting: Family Medicine

## 2017-10-04 ENCOUNTER — Other Ambulatory Visit: Payer: Self-pay | Admitting: Family Medicine

## 2017-10-04 NOTE — Telephone Encounter (Signed)
Tried to reach patient. No VM set up.  If patient calls please let him know he needs to schedule an appt in order to get his lisinopril refilled.

## 2017-10-04 NOTE — Telephone Encounter (Signed)
Lisinopril 2.5 mg refill Last Refill:04/10/17  # 90 Last OV: 12/13/16       06/13/17  [place] PCP: Wynetta Emery [id number]

## 2017-10-23 ENCOUNTER — Telehealth: Payer: Self-pay | Admitting: Family Medicine

## 2017-10-23 MED ORDER — LISINOPRIL 2.5 MG PO TABS: 2.5000 mg | ORAL_TABLET | Freq: Every day | ORAL | 1 refills | Status: DC

## 2017-10-23 NOTE — Telephone Encounter (Signed)
Copied from [place] [phone]. Topic: Quick Communication - Rx Refill/Question >> Oct 23, 2017  3:38 PM Waylan Rocher, Lumin L wrote: Medication: lisinopril (PRINIVIL,ZESTRIL) 2.5 MG tablet  Has the patient contacted their pharmacy? Yes.   (Agent: If no, request that the patient contact the pharmacy for the refill.) (Agent: If yes, when and what did the pharmacy advise?)  Preferred Pharmacy (with phone number or street name): [id number] [address] [place] [address] [place] [address] Phone: [phone] Fax: [phone]   Agent: Please be advised that RX refills may take up to 3 business days. We ask that you follow-up with your pharmacy.

## 2017-11-05 DIAGNOSIS — G4733 Obstructive sleep apnea (adult) (pediatric): Secondary | ICD-10-CM | POA: Diagnosis not present

## 2017-11-08 ENCOUNTER — Encounter: Payer: Self-pay | Admitting: Family Medicine

## 2017-11-08 ENCOUNTER — Ambulatory Visit: Payer: BLUE CROSS/BLUE SHIELD | Admitting: Family Medicine

## 2017-11-08 VITALS — BP 129/91 | HR 75 | Temp 98.4°F | Ht 68.0 in | Wt 261.6 lb

## 2017-11-08 DIAGNOSIS — E782 Mixed hyperlipidemia: Secondary | ICD-10-CM | POA: Diagnosis not present

## 2017-11-08 DIAGNOSIS — J069 Acute upper respiratory infection, unspecified: Secondary | ICD-10-CM | POA: Diagnosis not present

## 2017-11-08 DIAGNOSIS — Z1211 Encounter for screening for malignant neoplasm of colon: Secondary | ICD-10-CM | POA: Diagnosis not present

## 2017-11-08 DIAGNOSIS — I129 Hypertensive chronic kidney disease with stage 1 through stage 4 chronic kidney disease, or unspecified chronic kidney disease: Secondary | ICD-10-CM

## 2017-11-08 DIAGNOSIS — F321 Major depressive disorder, single episode, moderate: Secondary | ICD-10-CM

## 2017-11-08 MED ORDER — SERTRALINE HCL 100 MG PO TABS: 100.0000 mg | ORAL_TABLET | Freq: Every day | ORAL | 1 refills | Status: DC

## 2017-11-08 MED ORDER — HYDROCOD POLST-CPM POLST ER 10-8 MG/5ML PO SUER: 5.0000 mL | Freq: Every evening | ORAL | 0 refills | Status: DC | PRN

## 2017-11-08 MED ORDER — PREDNISONE 50 MG PO TABS: 50.0000 mg | ORAL_TABLET | Freq: Every day | ORAL | 0 refills | Status: DC

## 2017-11-08 MED ORDER — LISINOPRIL 2.5 MG PO TABS: 2.5000 mg | ORAL_TABLET | Freq: Every day | ORAL | 1 refills | Status: DC

## 2017-11-08 MED ORDER — AMLODIPINE BESYLATE 5 MG PO TABS: ORAL_TABLET | ORAL | 1 refills | Status: DC

## 2017-11-08 NOTE — Assessment & Plan Note (Signed)
Under good control on current regimen. Continue current regimen. Continue to monitor. Call with any concerns. Refills given.   

## 2017-11-08 NOTE — Progress Notes (Signed)
BP (!) 129/91 (BP Location: Left Arm, Cuff Size: Large)   Pulse 75   Temp 98.4 F (36.9 C)   Ht _0  (1.727 m)   Wt 261 lb 9 oz (118.6 kg)   SpO2 97%   BMI 39.77 kg/m    Subjective:    Patient ID: Eric Blevins, male    DOB: 1965/03/03, 52 y.o.   MRN: [id number]  HPI: Eric Blevins is a 52 y.o. male  Chief Complaint  Patient presents with  . Depression  . Hypertension  . URI    X 4 days   HYPERTENSION / HYPERLIPIDEMIA Satisfied with current treatment? yes Duration of hypertension: chronic BP monitoring frequency: not checking BP medication side effects: no Past BP meds: amlodipine, lisinopril Duration of hyperlipidemia: chronic Cholesterol medication side effects: Not on anything Cholesterol supplements: none Medication compliance: excellent compliance Aspirin: no Recent stressors: no Recurrent headaches: no Visual changes: no Palpitations: no Dyspnea: no Chest pain: no Lower extremity edema: no Dizzy/lightheaded: no  DEPRESSION Mood status: controlled Satisfied with current treatment?: yes Symptom severity: mild  Duration of current treatment : chronic Side effects: no Medication compliance: excellent compliance Psychotherapy/counseling: no  Previous psychiatric medications: prozac Depressed mood: no Anxious mood: no Anhedonia: no Significant weight loss or gain: no Insomnia: no  Fatigue: no Feelings of worthlessness or guilt: no Impaired concentration/indecisiveness: no Suicidal ideations: no Hopelessness: no Crying spells: no Depression screen [facility] 2/9 11/08/2017 09/28/2016 09/28/2015  Decreased Interest 0 3 0  Down, Depressed, Hopeless 0 3 0  PHQ - 2 Score 0 6 0  Altered sleeping 0 0 -  Tired, decreased energy 0 3 -  Change in appetite 0 2 -  Feeling bad or failure about yourself  0 0 -  Trouble concentrating 0 2 -  Moving slowly or fidgety/restless 0 0 -  Suicidal thoughts 0 0 -  PHQ-9 Score 0 13 -  Difficult doing work/chores Not  difficult at all - -   GAD 7 : Generalized Anxiety Score 11/08/2017 12/13/2016  Nervous, Anxious, on Edge 0 0  Control/stop worrying 0 1  Worry too much - different things 0 1  Trouble relaxing 0 0  Restless 0 0  Easily annoyed or irritable 0 0  Afraid - awful might happen 0 0  Total GAD 7 Score 0 2  Anxiety Difficulty Not difficult at all Somewhat difficult    UPPER RESPIRATORY TRACT INFECTION Duration:  Worst symptom: congestion, cough Fever: no Cough: yes Shortness of breath: yes Wheezing: yes Chest pain: yes, with cough Chest tightness: yes Chest congestion: yes Nasal congestion: yes Runny nose: yes Post nasal drip: yes Sneezing: no Sore throat: no Swollen glands: no Sinus pressure: no Headache: yes Face pain: no Toothache: yes Ear pain: no  Ear pressure: yes "right Eyes red/itching:yes Eye drainage/crusting: yes  Vomiting: no Rash: no Fatigue: yes Sick contacts: yes Strep contacts: no  Context: better Recurrent sinusitis: no Relief with OTC cold/cough medications: no  Treatments attempted: none   Relevant past medical, surgical, family and social history reviewed and updated as indicated. Interim medical history since our last visit reviewed. Allergies and medications reviewed and updated.  Review of Systems  Constitutional: Negative.   HENT: Positive for congestion, postnasal drip, rhinorrhea, sinus pressure and sneezing. Negative for dental problem, drooling, ear discharge, ear pain, facial swelling, hearing loss, mouth sores, nosebleeds, sinus pain, sore throat, tinnitus, trouble swallowing and voice change.   Respiratory: Negative.   Cardiovascular: Negative.  Neurological: Negative.   Psychiatric/Behavioral: Negative.     Per HPI unless specifically indicated above     Objective:    BP (!) 129/91 (BP Location: Left Arm, Cuff Size: Large)   Pulse 75   Temp 98.4 F (36.9 C)   Ht _0  (1.727 m)   Wt 261 lb 9 oz (118.6 kg)   SpO2 97%    BMI 39.77 kg/m   Wt Readings from Last 3 Encounters:  11/08/17 261 lb 9 oz (118.6 kg)  12/13/16 262 lb 2 oz (118.9 kg)  09/28/16 253 lb 1 oz (114.8 kg)    Physical Exam  Constitutional: He is oriented to person, place, and time. He appears well-developed and well-nourished. No distress.  HENT:  Head: Normocephalic and atraumatic.  Right Ear: Hearing and external ear normal.  Left Ear: Hearing and external ear normal.  Nose: Nose normal.  Mouth/Throat: Oropharynx is clear and moist. No oropharyngeal exudate.  Eyes: Pupils are equal, round, and reactive to light. Conjunctivae, EOM and lids are normal. Right eye exhibits no discharge. Left eye exhibits no discharge. No scleral icterus.  Neck: Normal range of motion. Neck supple. No JVD present. No tracheal deviation present. No thyromegaly present.  Cardiovascular: Normal rate, regular rhythm, normal heart sounds and intact distal pulses. Exam reveals no gallop and no friction rub.  No murmur heard. Pulmonary/Chest: Effort normal and breath sounds normal. No stridor. No respiratory distress. He has no wheezes. He has no rales. He exhibits no tenderness.  Musculoskeletal: Normal range of motion.  Lymphadenopathy:    He has no cervical adenopathy.  Neurological: He is alert and oriented to person, place, and time.  Skin: Skin is warm, dry and intact. Capillary refill takes less than 2 seconds. No rash noted. He is not diaphoretic. No erythema. No pallor.  Psychiatric: He has a normal mood and affect. His speech is normal and behavior is normal. Judgment and thought content normal. Cognition and memory are normal.  Nursing note and vitals reviewed.   Results for orders placed or performed in visit on 09/28/16  Microscopic Examination  Result Value Ref Range   WBC, UA 0-5 0 - 5 /hpf   RBC, UA None seen 0 - 2 /hpf   Epithelial Cells (non renal) CANCELED    Bacteria, UA Moderate (A) None seen/Few  Urine Culture, Reflex  Result Value Ref  Range   Urine Culture, Routine Final report    Organism ID, Bacteria No growth   CBC with Differential/Platelet  Result Value Ref Range   WBC 6.3 3.4 - 10.8 x10E3/uL   RBC 5.15 4.14 - 5.80 x10E6/uL   Hemoglobin 15.9 13.0 - 17.7 g/dL   Hematocrit 46.4 37.5 - 51.0 %   MCV 90 79 - 97 fL   MCH 30.9 26.6 - 33.0 pg   MCHC 34.3 31.5 - 35.7 g/dL   RDW 13.7 12.3 - 15.4 %   Platelets 383 (H) 150 - 379 x10E3/uL   Neutrophils 42 Not Estab. %   Lymphs 47 Not Estab. %   Monocytes 7 Not Estab. %   Eos 4 Not Estab. %   Basos 0 Not Estab. %   Neutrophils Absolute 2.6 1.4 - 7.0 x10E3/uL   Lymphocytes Absolute 2.9 0.7 - 3.1 x10E3/uL   Monocytes Absolute 0.4 0.1 - 0.9 x10E3/uL   EOS (ABSOLUTE) 0.2 0.0 - 0.4 x10E3/uL   Basophils Absolute 0.0 0.0 - 0.2 x10E3/uL   Immature Granulocytes 0 Not Estab. %   Immature Grans (  Abs) 0.0 0.0 - 0.1 x10E3/uL  Comprehensive metabolic panel  Result Value Ref Range   Glucose 87 65 - 99 mg/dL   BUN 12 6 - 24 mg/dL   Creatinine, Ser 1.12 0.76 - 1.27 mg/dL   GFR calc non Af Amer 76 >59 mL/min/1.73   GFR calc Af Amer 88 >59 mL/min/1.73   BUN/Creatinine Ratio 11 9 - 20   Sodium 138 134 - 144 mmol/L   Potassium 4.3 3.5 - 5.2 mmol/L   Chloride 99 96 - 106 mmol/L   CO2 24 20 - 29 mmol/L   Calcium 9.7 8.7 - 10.2 mg/dL   Total Protein 7.7 6.0 - 8.5 g/dL   Albumin 4.3 3.5 - 5.5 g/dL   Globulin, Total 3.4 1.5 - 4.5 g/dL   Albumin/Globulin Ratio 1.3 1.2 - 2.2   Bilirubin Total 0.3 0.0 - 1.2 mg/dL   Alkaline Phosphatase 84 39 - 117 IU/L   AST 23 0 - 40 IU/L   ALT 23 0 - 44 IU/L  Hgb A1c w/o eAG  Result Value Ref Range   Hgb A1c MFr Bld 5.9 (H) 4.8 - 5.6 %  Lipid Panel w/o Chol/HDL Ratio  Result Value Ref Range   Cholesterol, Total 232 (H) 100 - 199 mg/dL   Triglycerides 133 0 - 149 mg/dL   HDL 52 >39 mg/dL   VLDL Cholesterol Cal 27 5 - 40 mg/dL   LDL Calculated 153 (H) 0 - 99 mg/dL  PSA  Result Value Ref Range   Prostate Specific Ag, Serum 0.8 0.0 - 4.0 ng/mL    TSH  Result Value Ref Range   TSH 1.570 0.450 - 4.500 uIU/mL  UA/M w/rflx Culture, Routine  Result Value Ref Range   Specific Gravity, UA 1.015 1.005 - 1.030   pH, UA 7.0 5.0 - 7.5   Color, UA Yellow Yellow   Appearance Ur Clear Clear   Leukocytes, UA Trace (A) Negative   Protein, UA Negative Negative/Trace   Glucose, UA Negative Negative   Ketones, UA Negative Negative   RBC, UA Negative Negative   Bilirubin, UA Negative Negative   Urobilinogen, Ur 0.2 0.2 - 1.0 mg/dL   Nitrite, UA Negative Negative   Microscopic Examination See below:    Urinalysis Reflex Comment   Lyme Ab/Western Blot Reflex  Result Value Ref Range   Lyme IgG/IgM Ab <0.91 0.00 - 0.90 ISR   LYME DISEASE AB, QUANT, IGM <0.80 0.00 - 3.23 index  Ehrlichia Antibody Panel  Result Value Ref Range   E.Chaffeensis (HME) IgG Negative Neg:<1:64   E. Chaffeensis (HME) IgM Titer Negative Neg:<1:20   HGE IgG Titer Negative Neg:<1:64   HGE IgM Titer Negative Neg:<1:20  Babesia microti Antibody Panel  Result Value Ref Range   Babesia microti IgM <1:10 Neg:<1:10   Babesia microti IgG <1:10 Neg:<1:10  Rocky mtn spotted fvr abs pnl(IgG+IgM)  Result Value Ref Range   RMSF IgG Negative Negative   RMSF IgM 0.21 0.00 - 0.89 index  Uric acid  Result Value Ref Range   Uric Acid 8.1 3.7 - 8.6 mg/dL  VITAMIN D 25 Hydroxy (Vit-D Deficiency, Fractures)  Result Value Ref Range   Vit D, 25-Hydroxy 25.7 (L) 30.0 - 100.0 ng/mL  RA Qn+CCP(IgG/A)+SjoSSA+SjoSSB  Result Value Ref Range   Rhuematoid fact SerPl-aCnc <10.0 0.0 - 13.9 IU/mL   ENA SSA (RO) Ab <0.2 0.0 - 0.9 AI   ENA SSB (LA) Ab <0.2 0.0 - 0.9 AI   Cyclic Citrullin Peptide Ab  8 0 - 19 units  Antinuclear Antib (ANA)  Result Value Ref Range   Anti Nuclear Antibody(ANA) Negative Negative  Sed Rate (ESR)  Result Value Ref Range   Sed Rate 6 0 - 30 mm/hr  Testosterone, free, total  Result Value Ref Range   Testosterone 189 (L) 264 - 916 ng/dL   Testosterone, Free 6.9  (L) 7.2 - 24.0 pg/mL   Sex Hormone Binding 26.2 19.3 - 76.4 nmol/L      Assessment & Plan:   Problem List Items Addressed This Visit      Genitourinary   Benign hypertensive renal disease - Primary    Under good control on current regimen. Continue current regimen. Continue to monitor. Call with any concerns. Refills given.        Relevant Orders   Comprehensive metabolic panel     Other   Hyperlipidemia    Under good control on current regimen. Continue current regimen. Continue to monitor. Call with any concerns. Refills given.        Relevant Medications   amLODipine (NORVASC) 5 MG tablet   lisinopril (PRINIVIL,ZESTRIL) 2.5 MG tablet   Other Relevant Orders   Comprehensive metabolic panel   Lipid Panel w/o Chol/HDL Ratio   Depression, major, single episode, moderate (HCC)    Under good control on current regimen. Continue current regimen. Continue to monitor. Call with any concerns. Refills given.        Relevant Medications   sertraline (ZOLOFT) 100 MG tablet    Other Visit Diagnoses    Screening for colon cancer       Referral to GI made today.   Relevant Orders   Ambulatory referral to Gastroenterology   Viral upper respiratory tract infection       Will treat with burst of prednisone. Call with any concerns. Continue to monitor.       Follow up plan: Return in about 6 months (around 05/09/2018) for Physical.

## 2017-11-09 ENCOUNTER — Encounter: Payer: Self-pay | Admitting: Family Medicine

## 2017-11-09 LAB — COMPREHENSIVE METABOLIC PANEL
ALT: 25 IU/L (ref 0–44)
AST: 25 IU/L (ref 0–40)
Albumin/Globulin Ratio: 1.5 (ref 1.2–2.2)
Albumin: 4.4 g/dL (ref 3.5–5.5)
Alkaline Phosphatase: 92 IU/L (ref 39–117)
BUN/Creatinine Ratio: 11 (ref 9–20)
BUN: 11 mg/dL (ref 6–24)
Bilirubin Total: 0.3 mg/dL (ref 0.0–1.2)
CO2: 25 mmol/L (ref 20–29)
Calcium: 9.8 mg/dL (ref 8.7–10.2)
Chloride: 100 mmol/L (ref 96–106)
Creatinine, Ser: 1.04 mg/dL (ref 0.76–1.27)
GFR calc Af Amer: 96 mL/min/{1.73_m2} (ref >59)
GFR calc non Af Amer: 83 mL/min/{1.73_m2} (ref >59)
GLOBULIN, TOTAL: 3 g/dL (ref 1.5–4.5)
Glucose: 95 mg/dL (ref 65–99)
POTASSIUM: 5.2 mmol/L (ref 3.5–5.2)
SODIUM: 142 mmol/L (ref 134–144)
Total Protein: 7.4 g/dL (ref 6.0–8.5)

## 2017-11-09 LAB — LIPID PANEL W/O CHOL/HDL RATIO
Cholesterol, Total: 229 mg/dL — ABNORMAL HIGH (ref 100–199)
HDL: 51 mg/dL (ref >39)
LDL Calculated: 157 mg/dL — ABNORMAL HIGH (ref 0–99)
TRIGLYCERIDES: 103 mg/dL (ref 0–149)
VLDL Cholesterol Cal: 21 mg/dL (ref 5–40)

## 2017-12-05 ENCOUNTER — Other Ambulatory Visit: Payer: Self-pay

## 2017-12-05 DIAGNOSIS — Z1211 Encounter for screening for malignant neoplasm of colon: Secondary | ICD-10-CM

## 2017-12-14 ENCOUNTER — Other Ambulatory Visit: Payer: Self-pay

## 2017-12-14 MED ORDER — PEG 3350-KCL-NA BICARB-NACL 420 G PO SOLR: 4000.0000 mL | Freq: Once | ORAL | 0 refills | Status: AC

## 2017-12-17 ENCOUNTER — Ambulatory Visit
Admission: RE | Admit: 2017-12-17 | Discharge: 2017-12-17 | Disposition: A | Discharge status: Home / Self Care | Payer: BLUE CROSS/BLUE SHIELD | Source: Ambulatory Visit | Attending: Gastroenterology | Admitting: Gastroenterology

## 2017-12-17 ENCOUNTER — Ambulatory Visit: Payer: BLUE CROSS/BLUE SHIELD | Admitting: Anesthesiology

## 2017-12-17 ENCOUNTER — Encounter: Payer: Self-pay | Admitting: Anesthesiology

## 2017-12-17 ENCOUNTER — Encounter
Admission: RE | Disposition: A | Discharge status: Home / Self Care | Payer: Self-pay | Source: Ambulatory Visit | Attending: Gastroenterology

## 2017-12-17 DIAGNOSIS — Z6837 Body mass index (BMI) 37.0-37.9, adult: Secondary | ICD-10-CM | POA: Diagnosis not present

## 2017-12-17 DIAGNOSIS — E669 Obesity, unspecified: Secondary | ICD-10-CM | POA: Diagnosis not present

## 2017-12-17 DIAGNOSIS — G4730 Sleep apnea, unspecified: Secondary | ICD-10-CM | POA: Insufficient documentation

## 2017-12-17 DIAGNOSIS — E785 Hyperlipidemia, unspecified: Secondary | ICD-10-CM | POA: Insufficient documentation

## 2017-12-17 DIAGNOSIS — K5790 Diverticulosis of intestine, part unspecified, without perforation or abscess without bleeding: Secondary | ICD-10-CM | POA: Diagnosis not present

## 2017-12-17 DIAGNOSIS — N181 Chronic kidney disease, stage 1: Secondary | ICD-10-CM | POA: Insufficient documentation

## 2017-12-17 DIAGNOSIS — Z79899 Other long term (current) drug therapy: Secondary | ICD-10-CM | POA: Insufficient documentation

## 2017-12-17 DIAGNOSIS — Z1211 Encounter for screening for malignant neoplasm of colon: Secondary | ICD-10-CM | POA: Diagnosis not present

## 2017-12-17 DIAGNOSIS — I129 Hypertensive chronic kidney disease with stage 1 through stage 4 chronic kidney disease, or unspecified chronic kidney disease: Secondary | ICD-10-CM | POA: Insufficient documentation

## 2017-12-17 DIAGNOSIS — K5730 Diverticulosis of large intestine without perforation or abscess without bleeding: Secondary | ICD-10-CM | POA: Diagnosis not present

## 2017-12-17 HISTORY — PX: COLONOSCOPY WITH PROPOFOL: SHX5780

## 2017-12-17 SURGERY — COLONOSCOPY WITH PROPOFOL
Anesthesia: General

## 2017-12-17 MED ORDER — PROPOFOL 500 MG/50ML IV EMUL
INTRAVENOUS | Status: DC | PRN
  Administered 2017-12-17: 75 ug/kg/min via INTRAVENOUS

## 2017-12-17 MED ORDER — PROPOFOL 500 MG/50ML IV EMUL
INTRAVENOUS | Status: AC
  Filled 2017-12-17: qty 50

## 2017-12-17 MED ORDER — SODIUM CHLORIDE 0.9 % IV SOLN
INTRAVENOUS | Status: DC
  Administered 2017-12-17: 12:00:00 via INTRAVENOUS
  Administered 2017-12-17: 1000 mL via INTRAVENOUS

## 2017-12-17 MED ORDER — PROPOFOL 10 MG/ML IV BOLUS
INTRAVENOUS | Status: AC
  Filled 2017-12-17: qty 20

## 2017-12-17 MED ORDER — PROPOFOL 10 MG/ML IV BOLUS: INTRAVENOUS | Status: DC | PRN

## 2017-12-17 MED ORDER — PROPOFOL 10 MG/ML IV BOLUS
INTRAVENOUS | Status: DC | PRN
  Administered 2017-12-17 (×2): 20 mg via INTRAVENOUS
  Administered 2017-12-17: 10 mg via INTRAVENOUS
  Administered 2017-12-17: 20 mg via INTRAVENOUS

## 2017-12-17 NOTE — Anesthesia Post-op Follow-up Note (Signed)
Anesthesia QCDR form completed.        

## 2017-12-17 NOTE — Anesthesia Postprocedure Evaluation (Signed)
Anesthesia Post Note  Patient: Eric Blevins  Procedure(s) Performed: COLONOSCOPY WITH PROPOFOL (N/A )  Patient location during evaluation: Endoscopy Anesthesia Type: General Level of consciousness: awake and alert Pain management: pain level controlled Vital Signs Assessment: post-procedure vital signs reviewed and stable Respiratory status: spontaneous breathing, nonlabored ventilation, respiratory function stable and patient connected to nasal cannula oxygen Cardiovascular status: blood pressure returned to baseline and stable Postop Assessment: no apparent nausea or vomiting Anesthetic complications: no     Last Vitals:  Vitals:   12/17/17 1230 12/17/17 1235  BP:  124/85  Pulse: 63 70  Resp: (!) 24 10  Temp:    SpO2: 100% 93%    Last Pain:  Vitals:   12/17/17 1235  TempSrc:   PainSc: 0-No pain                 Precious Haws Piscitello

## 2017-12-17 NOTE — Anesthesia Preprocedure Evaluation (Signed)
Anesthesia Evaluation  Patient identified by MRN, date of birth, ID band Patient awake    Reviewed: Allergy & Precautions, H&P , NPO status , Patient's Chart, lab work & pertinent test results  History of Anesthesia Complications Negative for: history of anesthetic complications  Airway Mallampati: III  TM Distance: <3 FB Neck ROM: limited    Dental  (+) Chipped, Poor Dentition   Pulmonary neg shortness of breath, sleep apnea and Continuous Positive Airway Pressure Ventilation ,           Cardiovascular Exercise Tolerance: Good hypertension, (-) angina(-) Past MI and (-) DOE      Neuro/Psych PSYCHIATRIC DISORDERS negative neurological ROS     GI/Hepatic negative GI ROS, Neg liver ROS,   Endo/Other  negative endocrine ROS  Renal/GU Renal disease  negative genitourinary   Musculoskeletal   Abdominal   Peds  Hematology negative hematology ROS (+)   Anesthesia Other Findings Past Medical History: No date: Allergy No date: Benign hypertensive renal disease No date: Chronic kidney disease, stage I No date: ED (erectile dysfunction) No date: Hyperlipidemia No date: Hypertension No date: Hypogonadism in male No date: Obesity No date: Sleep apnea No date: Thrombocytosis ([place])  Past Surgical History: 1990: LUMBAR DISC SURGERY     Comment:  l4/5   BMI    Body Mass Index:  37.25 kg/m      Reproductive/Obstetrics negative OB ROS                             Anesthesia Physical Anesthesia Plan  ASA: III  Anesthesia Plan: General   Post-op Pain Management:    Induction: Intravenous  PONV Risk Score and Plan: Propofol infusion and TIVA  Airway Management Planned: Natural Airway and Nasal Cannula  Additional Equipment:   Intra-op Plan:   Post-operative Plan:   Informed Consent: I have reviewed the patients History and Physical, chart, labs and discussed the procedure  including the risks, benefits and alternatives for the proposed anesthesia with the patient or authorized representative who has indicated his/her understanding and acceptance.   Dental Advisory Given  Plan Discussed with: Anesthesiologist, CRNA and Surgeon  Anesthesia Plan Comments: (Patient consented for risks of anesthesia including but not limited to:  - adverse reactions to medications - risk of intubation if required - damage to teeth, lips or other oral mucosa - sore throat or hoarseness - Damage to heart, brain, lungs or loss of life  Patient voiced understanding.)        Anesthesia Quick Evaluation

## 2017-12-17 NOTE — Op Note (Signed)
[facility] Patient Name: Eric Blevins Procedure Date: 12/17/2017 11:37 AM MRN: [id number] Account #: [id number] Date of Birth: 06-18-1965 Admit Type: Outpatient Age: 52 Room: [facility] ENDO ROOM 3 Gender: Male Note Status: Finalized Procedure:            Colonoscopy Indications:          Screening for colorectal malignant neoplasm, This is                        the patient's first colonoscopy Providers:            Lin Landsman MD, MD Referring MD:         Valerie Roys (Referring MD) Medicines:            Monitored Anesthesia Care Complications:        No immediate complications. Estimated blood loss: None. Procedure:            Pre-Anesthesia Assessment:                       - Prior to the procedure, a History and Physical was                        performed, and patient medications and allergies were                        reviewed. The patient is competent. The risks and                        benefits of the procedure and the sedation options and                        risks were discussed with the patient. All questions                        were answered and informed consent was obtained.                        Patient identification and proposed procedure were                        verified by the physician, the nurse, the                        anesthesiologist, the anesthetist and the technician in                        the pre-procedure area in the procedure room in the                        endoscopy suite. Mental Status Examination: alert and                        oriented. Airway Examination: normal oropharyngeal                        airway and neck mobility. Respiratory Examination:                        clear to auscultation. CV Examination: normal.  Prophylactic Antibiotics: The patient does not require                        prophylactic antibiotics. Prior Anticoagulants: The           patient has taken no previous anticoagulant or                        antiplatelet agents. ASA Grade Assessment: III - A                        patient with severe systemic disease. After reviewing                        the risks and benefits, the patient was deemed in                        satisfactory condition to undergo the procedure. The                        anesthesia plan was to use monitored anesthesia care                        (MAC). Immediately prior to administration of                        medications, the patient was re-assessed for adequacy                        to receive sedatives. The heart rate, respiratory rate,                        oxygen saturations, blood pressure, adequacy of                        pulmonary ventilation, and response to care were                        monitored throughout the procedure. The physical status                        of the patient was re-assessed after the procedure.                       After obtaining informed consent, the colonoscope was                        passed under direct vision. Throughout the procedure,                        the patient's blood pressure, pulse, and oxygen                        saturations were monitored continuously. The                        Colonoscope was introduced through the anus and                        advanced to the the terminal ileum, with identification  of the appendiceal orifice and IC valve. The                        colonoscopy was performed with moderate difficulty due                        to significant looping and the patient's body habitus.                        Successful completion of the procedure was aided by                        increasing the dose of sedation medication and applying                        abdominal pressure. The patient tolerated the procedure                        well. The quality of the bowel preparation was                         evaluated using the BBPS [facility] Bowel Preparation                        Scale) with scores of: Right Colon = 2 (minor amount of                        residual staining, small fragments of stool and/or                        opaque liquid, but mucosa seen well), Transverse Colon                        = 3 (entire mucosa seen well with no residual staining,                        small fragments of stool or opaque liquid) and Left                        Colon = 3 (entire mucosa seen well with no residual                        staining, small fragments of stool or opaque liquid).                        The total BBPS score equals 8. The quality of the bowel                        preparation was fair. Findings:      The perianal and digital rectal examinations were normal. Pertinent       negatives include normal sphincter tone and no palpable rectal lesions.      A few diverticula were found in the transverse colon and ascending       colon. There was no evidence of diverticular bleeding.      A moderate amount of liquid stool was found in the transverse colon, in       the ascending colon and in the cecum, precluding  visualization. Lavage       of the area was performed using a moderate amount of sterile water,       resulting in clearance with fair visualization.      The exam was otherwise without abnormality.      The retroflexed view of the distal rectum and anal verge was normal and       showed no anal or rectal abnormalities. Impression:           - Preparation of the colon was fair.                       - Moderate diverticulosis in the transverse colon and                        in the ascending colon. There was no evidence of                        diverticular bleeding.                       - Stool in the transverse colon, in the ascending colon                        and in the cecum.                       - The examination was otherwise normal.                        - The distal rectum and anal verge are normal on                        retroflexion view.                       - No specimens collected. Recommendation:       - Discharge patient to home (with escort).                       - Resume previous diet today.                       - Continue present medications.                       - Repeat colonoscopy in 3 years for surveillance due to                        fair prep. Procedure Code(s):    --- Professional ---                       I9518, Colorectal cancer screening; colonoscopy on                        individual not meeting criteria for high risk Diagnosis Code(s):    --- Professional ---                       Z12.11, Encounter for screening for malignant neoplasm                        of  colon                       K57.30, Diverticulosis of large intestine without                        perforation or abscess without bleeding CPT copyright 2018 American Medical Association. All rights reserved. The codes documented in this report are preliminary and upon coder review may  be revised to meet current compliance requirements. Dr. Ulyess Mort Lin Landsman MD, MD 12/17/2017 12:03:17 PM This report has been signed electronically. Number of Addenda: 0 Note Initiated On: 12/17/2017 11:37 AM Scope Withdrawal Time: 0 hours 12 minutes 59 seconds  Total Procedure Duration: 0 hours 15 minutes 23 seconds       [facility]

## 2017-12-17 NOTE — H&P (Signed)
Cephas Darby, MD [address]  [place]  [place], [place] [address]  Main: [phone]  Fax: [phone] Pager: [phone]  Primary Care Physician:  Valerie Roys, DO Primary Gastroenterologist:  Dr. Cephas Darby  Pre-Procedure History & Physical: HPI:  Eric Blevins is a 52 y.o. male is here for an colonoscopy.   Past Medical History:  Diagnosis Date  . Allergy   . Benign hypertensive renal disease   . Chronic kidney disease, stage I   . ED (erectile dysfunction)   . Hyperlipidemia   . Hypertension   . Hypogonadism in male   . Obesity   . Sleep apnea   . Thrombocytosis ([place])     Past Surgical History:  Procedure Laterality Date  . LUMBAR DISC SURGERY  1990   l4/5     Prior to Admission medications   Medication Sig Start Date End Date Taking? Authorizing Provider  amLODipine (NORVASC) 5 MG tablet TAKE 1 TABLET (5 MG TOTAL) BY MOUTH DAILY. 11/08/17  Yes Johnson, Megan P, DO  lisinopril (PRINIVIL,ZESTRIL) 2.5 MG tablet Take 1 tablet (2.5 mg total) by mouth daily. 11/08/17  Yes Johnson, Megan P, DO  sertraline (ZOLOFT) 100 MG tablet Take 1 tablet (100 mg total) by mouth daily. 11/08/17  Yes Johnson, Megan P, DO  chlorpheniramine-HYDROcodone (TUSSIONEX PENNKINETIC ER) 10-8 MG/5ML SUER Take 5 mLs by mouth at bedtime as needed. Patient not taking: Reported on 12/17/2017 11/08/17   Park Liter P, DO  predniSONE (DELTASONE) 50 MG tablet Take 1 tablet (50 mg total) by mouth daily with breakfast. Patient not taking: Reported on 12/17/2017 11/08/17   Park Liter P, DO    Allergies as of 12/05/2017  . (No Known Allergies)    Family History  Problem Relation Age of Onset  . Hypertension Father   . Hyperlipidemia Father   . Diabetes Mother   . Cancer Maternal Grandmother   . Cancer Maternal Grandfather   . Diabetes Paternal Grandmother   . Diabetes Brother   . Thyroid disease Brother     Social History   Socioeconomic History  . Marital status:  Married    Spouse name: Not on file  . Number of children: Not on file  . Years of education: Not on file  . Highest education level: Not on file  Occupational History  . Not on file  Social Needs  . Financial resource strain: Not on file  . Food insecurity:    Worry: Not on file    Inability: Not on file  . Transportation needs:    Medical: Not on file    Non-medical: Not on file  Tobacco Use  . Smoking status: Never Smoker  . Smokeless tobacco: Never Used  Substance and Sexual Activity  . Alcohol use: No  . Drug use: No  . Sexual activity: Yes    Birth control/protection: Condom  Lifestyle  . Physical activity:    Days per week: Not on file    Minutes per session: Not on file  . Stress: Not on file  Relationships  . Social connections:    Talks on phone: Not on file    Gets together: Not on file    Attends religious service: Not on file    Active member of club or organization: Not on file    Attends meetings of clubs or organizations: Not on file    Relationship status: Not on file  . Intimate partner violence:    Fear of current  or ex partner: Not on file    Emotionally abused: Not on file    Physically abused: Not on file    Forced sexual activity: Not on file  Other Topics Concern  . Not on file  Social History Narrative  . Not on file    Review of Systems: See HPI, otherwise negative ROS  Physical Exam: BP (!) 134/91   Pulse 71   Temp (!) 96.9 F (36.1 C) (Tympanic)   Resp 20   Ht 5\' 8"  (1.727 m)   Wt 111.1 kg   SpO2 97%   BMI 37.25 kg/m  General:   Alert,  pleasant and cooperative in NAD Head:  Normocephalic and atraumatic. Neck:  Supple; no masses or thyromegaly. Lungs:  Clear throughout to auscultation.    Heart:  Regular rate and rhythm. Abdomen:  Soft, nontender and nondistended. Normal bowel sounds, without guarding, and without rebound.   Neurologic:  Alert and  oriented x4;  grossly normal neurologically.  Impression/Plan: Eric Blevins is here for an colonoscopy to be performed for colon cancer screening  Risks, benefits, limitations, and alternatives regarding  colonoscopy have been reviewed with the patient.  Questions have been answered.  All parties agreeable.   Sherri Sear, MD  12/17/2017, 11:06 AM

## 2017-12-17 NOTE — Transfer of Care (Signed)
Immediate Anesthesia Transfer of Care Note  Patient: Eric Blevins  Procedure(s) Performed: COLONOSCOPY WITH PROPOFOL (N/A )  Patient Location: PACU  Anesthesia Type:MAC  Level of Consciousness: awake, alert  and oriented  Airway & Oxygen Therapy: Patient Spontanous Breathing and Patient connected to nasal cannula oxygen  Post-op Assessment: Report given to RN and Post -op Vital signs reviewed and stable  Post vital signs: stable  Last Vitals:  Vitals Value Taken Time  BP    Temp    Pulse    Resp    SpO2      Last Pain:  Vitals:   12/17/17 1009  TempSrc: Tympanic  PainSc: 0-No pain         Complications: No apparent anesthesia complications

## 2017-12-18 ENCOUNTER — Encounter: Payer: Self-pay | Admitting: Gastroenterology

## 2018-03-11 ENCOUNTER — Encounter: Payer: Self-pay | Admitting: Family Medicine

## 2018-05-09 ENCOUNTER — Ambulatory Visit: Payer: BLUE CROSS/BLUE SHIELD | Admitting: Family Medicine

## 2018-05-11 ENCOUNTER — Other Ambulatory Visit: Payer: Self-pay | Admitting: Family Medicine

## 2018-05-12 NOTE — Telephone Encounter (Signed)
90 day courtesy refill given per protocol. Appt scheduled on 05/16/18

## 2018-05-15 ENCOUNTER — Other Ambulatory Visit: Payer: Self-pay

## 2018-05-16 ENCOUNTER — Encounter: Payer: Self-pay | Admitting: Family Medicine

## 2018-05-16 ENCOUNTER — Ambulatory Visit (INDEPENDENT_AMBULATORY_CARE_PROVIDER_SITE_OTHER): Payer: BLUE CROSS/BLUE SHIELD | Admitting: Family Medicine

## 2018-05-16 VITALS — Ht 68.0 in | Wt 250.0 lb

## 2018-05-16 DIAGNOSIS — E782 Mixed hyperlipidemia: Secondary | ICD-10-CM

## 2018-05-16 DIAGNOSIS — I129 Hypertensive chronic kidney disease with stage 1 through stage 4 chronic kidney disease, or unspecified chronic kidney disease: Secondary | ICD-10-CM | POA: Diagnosis not present

## 2018-05-16 DIAGNOSIS — N181 Chronic kidney disease, stage 1: Secondary | ICD-10-CM | POA: Diagnosis not present

## 2018-05-16 DIAGNOSIS — F321 Major depressive disorder, single episode, moderate: Secondary | ICD-10-CM

## 2018-05-16 DIAGNOSIS — E291 Testicular hypofunction: Secondary | ICD-10-CM | POA: Diagnosis not present

## 2018-05-16 MED ORDER — LISINOPRIL 2.5 MG PO TABS: 2.5000 mg | ORAL_TABLET | Freq: Every day | ORAL | 1 refills | Status: DC

## 2018-05-16 MED ORDER — AMLODIPINE BESYLATE 5 MG PO TABS: ORAL_TABLET | ORAL | 1 refills | Status: DC

## 2018-05-16 MED ORDER — SERTRALINE HCL 100 MG PO TABS: 100.0000 mg | ORAL_TABLET | Freq: Every day | ORAL | 1 refills | Status: DC

## 2018-05-16 NOTE — Assessment & Plan Note (Signed)
Will return for recheck on his labs, Await results. Treat as needed.

## 2018-05-16 NOTE — Progress Notes (Signed)
Ht 5\' 8"  (1.727 m)   Wt 250 lb (113.4 kg)   BMI 38.01 kg/m    Subjective:    Patient ID: Eric Blevins, male    DOB: [birth year], 53 y.o.   MRN: [id number]  HPI: Eric Blevins is a 53 y.o. male  Chief Complaint  Patient presents with  . Hypertension  . Hyperlipidemia  . Depression  . TELEMEDICINE VISIT   HYPERTENSION / HYPERLIPIDEMIA Satisfied with current treatment? yes Duration of hypertension: chronic BP monitoring frequency: not checking BP medication side effects: no Past BP meds: amlopidine, lisinopril Duration of hyperlipidemia: chronic Cholesterol medication side effects: not on anything Cholesterol supplements: none Past cholesterol medications: none Medication compliance: excellent compliance Aspirin: no Recent stressors: yes Recurrent headaches: no Visual changes: no Palpitations: no Dyspnea: no Chest pain: no Lower extremity edema: no Dizzy/lightheaded: no  LOW TESTOSTERONE Duration: chronic Status: stable  Satisfied with current treatment:  Not on anything Decreased libido: no Fatigue: no Depressed mood: no Muscle weakness: no Erectile dysfunction: no  DEPRESSION Mood status: stable Satisfied with current treatment?: yes Symptom severity: mild  Duration of current treatment : chronic Side effects: no Medication compliance: excellent compliance Psychotherapy/counseling: no  Previous psychiatric medications: sertraline Depressed mood: no Anxious mood: no Anhedonia: no Significant weight loss or gain: no Insomnia: no  Fatigue: no Feelings of worthlessness or guilt: no Impaired concentration/indecisiveness: no Suicidal ideations: no Hopelessness: no Crying spells: no Depression screen [facility] 2/9 05/16/2018 11/08/2017 09/28/2016 09/28/2015  Decreased Interest 0 0 3 0  Down, Depressed, Hopeless 0 0 3 0  PHQ - 2 Score 0 0 6 0  Altered sleeping 0 0 0 -  Tired, decreased energy 0 0 3 -  Change in appetite 0 0 2 -  Feeling bad or failure  about yourself  0 0 0 -  Trouble concentrating 0 0 2 -  Moving slowly or fidgety/restless 0 0 0 -  Suicidal thoughts 0 0 0 -  PHQ-9 Score 0 0 13 -  Difficult doing work/chores - Not difficult at all - -    Relevant past medical, surgical, family and social history reviewed and updated as indicated. Interim medical history since our last visit reviewed. Allergies and medications reviewed and updated.  Review of Systems  Constitutional: Negative.   Respiratory: Negative.   Cardiovascular: Negative.   Gastrointestinal: Negative.   Musculoskeletal: Negative.   Neurological: Negative.   Psychiatric/Behavioral: Negative.     Per HPI unless specifically indicated above     Objective:    Ht 5\' 8"  (1.727 m)   Wt 250 lb (113.4 kg)   BMI 38.01 kg/m   Wt Readings from Last 3 Encounters:  05/16/18 250 lb (113.4 kg)  12/17/17 245 lb (111.1 kg)  11/08/17 261 lb 9 oz (118.6 kg)    Physical Exam Constitutional:      General: He is not in acute distress.    Appearance: Normal appearance. He is well-developed. He is obese. He is not ill-appearing, toxic-appearing or diaphoretic.  HENT:     Head: Normocephalic and atraumatic.     Right Ear: Hearing and external ear normal.     Left Ear: Hearing and external ear normal.     Nose: Nose normal.  Eyes:     General: Lids are normal. No scleral icterus.       Right eye: No discharge.        Left eye: No discharge.     Extraocular Movements: Extraocular movements intact.  Conjunctiva/sclera: Conjunctivae normal.     Pupils: Pupils are equal, round, and reactive to light.  Neck:     Musculoskeletal: Normal range of motion.  Pulmonary:     Effort: Pulmonary effort is normal. No respiratory distress.  Musculoskeletal: Normal range of motion.  Skin:    Coloration: Skin is not jaundiced or pale.     Findings: No bruising, erythema, lesion or rash.  Neurological:     General: No focal deficit present.     Mental Status: He is alert and  oriented to person, place, and time.  Psychiatric:        Mood and Affect: Mood normal.        Speech: Speech normal.        Behavior: Behavior normal.        Thought Content: Thought content normal.        Judgment: Judgment normal.     Results for orders placed or performed in visit on 11/08/17  Comprehensive metabolic panel  Result Value Ref Range   Glucose 95 65 - 99 mg/dL   BUN 11 6 - 24 mg/dL   Creatinine, Ser 1.04 0.76 - 1.27 mg/dL   GFR calc non Af Amer 83 >59 mL/min/1.73   GFR calc Af Amer 96 >59 mL/min/1.73   BUN/Creatinine Ratio 11 9 - 20   Sodium 142 134 - 144 mmol/L   Potassium 5.2 3.5 - 5.2 mmol/L   Chloride 100 96 - 106 mmol/L   CO2 25 20 - 29 mmol/L   Calcium 9.8 8.7 - 10.2 mg/dL   Total Protein 7.4 6.0 - 8.5 g/dL   Albumin 4.4 3.5 - 5.5 g/dL   Globulin, Total 3.0 1.5 - 4.5 g/dL   Albumin/Globulin Ratio 1.5 1.2 - 2.2   Bilirubin Total 0.3 0.0 - 1.2 mg/dL   Alkaline Phosphatase 92 39 - 117 IU/L   AST 25 0 - 40 IU/L   ALT 25 0 - 44 IU/L  Lipid Panel w/o Chol/HDL Ratio  Result Value Ref Range   Cholesterol, Total 229 (H) 100 - 199 mg/dL   Triglycerides 103 0 - 149 mg/dL   HDL 51 >39 mg/dL   VLDL Cholesterol Cal 21 5 - 40 mg/dL   LDL Calculated 157 (H) 0 - 99 mg/dL      Assessment & Plan:   Problem List Items Addressed This Visit      Endocrine   Hypogonadism in male    Will return for recheck on his labs, Await results. Treat as needed.       Relevant Orders   Comprehensive metabolic panel   Testosterone, free, total([facility])     Genitourinary   Benign hypertensive renal disease - Primary    Feels like he's been doing well. Will come in for BP check with labs to confirm. Refills given. Call with any concerns. Continue to monitor.       Relevant Orders   Comprehensive metabolic panel   Chronic kidney disease, stage I    Will return for recheck on his labs, Await results. Treat as needed.       Relevant Orders   Comprehensive  metabolic panel     Other   Hyperlipidemia   Relevant Medications   lisinopril (PRINIVIL,ZESTRIL) 2.5 MG tablet   amLODipine (NORVASC) 5 MG tablet   Other Relevant Orders   Comprehensive metabolic panel   Lipid Panel w/o Chol/HDL Ratio   Morbid obesity ([place])    Due to HTN and HLD. Will work  on diet and exercise with goal of losing 1-2lbs a week. Call with any concerns.       Depression, major, single episode, moderate ([place])    Doing really well on his sertraline. Continue current regimen. Continue to monitor. Call with any concerns.       Relevant Medications   sertraline (ZOLOFT) 100 MG tablet       Follow up plan: Return in about 6 months (around 11/16/2018) for Physical.   . This visit was completed via FaceTime due to the restrictions of the COVID-19 pandemic. All issues as above were discussed and addressed. Physical exam was done as above through visual confirmation on FaceTime. If it was felt that the patient should be evaluated in the office, they were directed there. The patient verbally consented to this visit. . Location of the patient: home . Location of the provider: home . Those involved with this call:  . Provider: Park Liter, DO . CMA: Merilyn Baba, CMA . Front Desk/Registration: Jill Side  . Time spent on call: 25 minutes with patient face to face via video conference. More than 50% of this time was spent in counseling and coordination of care.

## 2018-05-16 NOTE — Assessment & Plan Note (Signed)
Feels like he's been doing well. Will come in for BP check with labs to confirm. Refills given. Call with any concerns. Continue to monitor.

## 2018-05-16 NOTE — Assessment & Plan Note (Signed)
Due to HTN and HLD. Will work on diet and exercise with goal of losing 1-2lbs a week. Call with any concerns.

## 2018-05-16 NOTE — Assessment & Plan Note (Signed)
Doing really well on his sertraline. Continue current regimen. Continue to monitor. Call with any concerns.

## 2018-05-17 ENCOUNTER — Ambulatory Visit: Payer: BLUE CROSS/BLUE SHIELD

## 2018-05-17 ENCOUNTER — Other Ambulatory Visit: Payer: Self-pay

## 2018-05-17 ENCOUNTER — Other Ambulatory Visit: Payer: BLUE CROSS/BLUE SHIELD

## 2018-05-17 VITALS — BP 159/96

## 2018-05-17 DIAGNOSIS — I129 Hypertensive chronic kidney disease with stage 1 through stage 4 chronic kidney disease, or unspecified chronic kidney disease: Secondary | ICD-10-CM | POA: Diagnosis not present

## 2018-05-17 DIAGNOSIS — E291 Testicular hypofunction: Secondary | ICD-10-CM

## 2018-05-17 DIAGNOSIS — E782 Mixed hyperlipidemia: Secondary | ICD-10-CM | POA: Diagnosis not present

## 2018-05-17 DIAGNOSIS — N181 Chronic kidney disease, stage 1: Secondary | ICD-10-CM | POA: Diagnosis not present

## 2018-05-17 NOTE — Progress Notes (Signed)
Pt presented to office for blood pressure check. BP was checked on patient's right arm in a sitting position. Pt's BP was read at 159/96. Provider was notified.

## 2018-05-18 LAB — COMPREHENSIVE METABOLIC PANEL
ALBUMIN: 4.2 g/dL (ref 3.8–4.9)
ALK PHOS: 89 IU/L (ref 39–117)
ALT: 23 IU/L (ref 0–44)
AST: 23 IU/L (ref 0–40)
Albumin/Globulin Ratio: 1.3 (ref 1.2–2.2)
BILIRUBIN TOTAL: 0.3 mg/dL (ref 0.0–1.2)
BUN/Creatinine Ratio: 13 (ref 9–20)
BUN: 15 mg/dL (ref 6–24)
CHLORIDE: 99 mmol/L (ref 96–106)
CO2: 23 mmol/L (ref 20–29)
Calcium: 9.4 mg/dL (ref 8.7–10.2)
Creatinine, Ser: 1.13 mg/dL (ref 0.76–1.27)
GFR calc non Af Amer: 75 mL/min/{1.73_m2} (ref >59)
GFR, EST AFRICAN AMERICAN: 87 mL/min/{1.73_m2} (ref >59)
GLOBULIN, TOTAL: 3.2 g/dL (ref 1.5–4.5)
GLUCOSE: 90 mg/dL (ref 65–99)
Potassium: 5.1 mmol/L (ref 3.5–5.2)
Sodium: 139 mmol/L (ref 134–144)
TOTAL PROTEIN: 7.4 g/dL (ref 6.0–8.5)

## 2018-05-18 LAB — LIPID PANEL W/O CHOL/HDL RATIO
CHOLESTEROL TOTAL: 247 mg/dL — AB (ref 100–199)
HDL: 52 mg/dL (ref >39)
LDL Calculated: 172 mg/dL — ABNORMAL HIGH (ref 0–99)
Triglycerides: 116 mg/dL (ref 0–149)
VLDL CHOLESTEROL CAL: 23 mg/dL (ref 5–40)

## 2018-05-18 LAB — TESTOSTERONE, FREE, TOTAL, SHBG
Sex Hormone Binding: 19.2 nmol/L — ABNORMAL LOW (ref 19.3–76.4)
TESTOSTERONE: 169 ng/dL — AB (ref 264–916)
Testosterone, Free: 9.2 pg/mL (ref 7.2–24.0)

## 2018-05-20 ENCOUNTER — Telehealth: Payer: Self-pay | Admitting: Family Medicine

## 2018-05-20 DIAGNOSIS — E291 Testicular hypofunction: Secondary | ICD-10-CM

## 2018-05-20 MED ORDER — TESTOSTERONE 50 MG/5GM (1%) TD GEL: 5.0000 g | Freq: Every day | TRANSDERMAL | 5 refills | Status: DC

## 2018-05-20 NOTE — Telephone Encounter (Signed)
Message relayed to patient. Verbalized understanding and denied questions. Patient agreeable to treatment for testosterone.

## 2018-05-20 NOTE — Telephone Encounter (Signed)
Please let him know that his labs came back normal except his testosterone is very low- does he want to try something? Thanks!

## 2018-05-20 NOTE — Telephone Encounter (Signed)
Testosterone has been sent to his pharmacy, but we're likely going to need to do a PA and send his labs, so he may not be able to pick it up right away. I'd like him to come in after 1 month on it to get some blood work. Order in.

## 2018-05-20 NOTE — Telephone Encounter (Signed)
Prior Authorization initiated via CoverMyMeds for Testosterone Key: [id number]

## 2018-05-21 NOTE — Telephone Encounter (Signed)
Unidentified caller from [facility] called and left message on [place] regarding prior auth on 05/20/2018.  States that she needs to speak with Marianna Fuss. Phone number is [phone].

## 2018-05-22 NOTE — Telephone Encounter (Signed)
Eric Blevins, with [employer], calling back to speak with Keri regarding prior auth. Requesting a call back at her convenience.    Cb# [phone]

## 2018-05-22 NOTE — Telephone Encounter (Signed)
Spoke with [employer].  She was needed the collection and result times of patient's testosterone labs that were uploaded for his Prior Authorization for Testosterone Gel 1%. Colletta [place] stated she'll send it to the pharmacist and we would get a result soon.

## 2018-05-27 NOTE — Telephone Encounter (Signed)
Prior Auth was approved with information provided. Routing to provider as Juluis Rainier.  We should receive a fax of approval.

## 2018-09-18 ENCOUNTER — Encounter: Payer: Self-pay | Admitting: Emergency Medicine

## 2018-09-18 ENCOUNTER — Other Ambulatory Visit: Payer: Self-pay

## 2018-09-18 DIAGNOSIS — K760 Fatty (change of) liver, not elsewhere classified: Secondary | ICD-10-CM | POA: Diagnosis not present

## 2018-09-18 DIAGNOSIS — N132 Hydronephrosis with renal and ureteral calculous obstruction: Secondary | ICD-10-CM | POA: Insufficient documentation

## 2018-09-18 DIAGNOSIS — Z79899 Other long term (current) drug therapy: Secondary | ICD-10-CM | POA: Insufficient documentation

## 2018-09-18 DIAGNOSIS — E785 Hyperlipidemia, unspecified: Secondary | ICD-10-CM | POA: Diagnosis not present

## 2018-09-18 DIAGNOSIS — N23 Unspecified renal colic: Secondary | ICD-10-CM | POA: Diagnosis not present

## 2018-09-18 DIAGNOSIS — N181 Chronic kidney disease, stage 1: Secondary | ICD-10-CM | POA: Diagnosis not present

## 2018-09-18 DIAGNOSIS — N200 Calculus of kidney: Secondary | ICD-10-CM | POA: Diagnosis not present

## 2018-09-18 DIAGNOSIS — I129 Hypertensive chronic kidney disease with stage 1 through stage 4 chronic kidney disease, or unspecified chronic kidney disease: Secondary | ICD-10-CM | POA: Diagnosis not present

## 2018-09-18 DIAGNOSIS — R1031 Right lower quadrant pain: Secondary | ICD-10-CM | POA: Diagnosis present

## 2018-09-18 LAB — BASIC METABOLIC PANEL
Anion gap: 8 (ref 5–15)
BUN: 17 mg/dL (ref 6–20)
CO2: 25 mmol/L (ref 22–32)
Calcium: 8.9 mg/dL (ref 8.9–10.3)
Chloride: 104 mmol/L (ref 98–111)
Creatinine, Ser: 1.47 mg/dL — ABNORMAL HIGH (ref 0.61–1.24)
GFR calc Af Amer: >60 mL/min (ref >60)
GFR calc non Af Amer: 54 mL/min — ABNORMAL LOW (ref >60)
Glucose, Bld: 112 mg/dL — ABNORMAL HIGH (ref 70–99)
Potassium: 4.2 mmol/L (ref 3.5–5.1)
Sodium: 137 mmol/L (ref 135–145)

## 2018-09-18 LAB — CBC
HCT: 45.9 % (ref 39.0–52.0)
Hemoglobin: 15.3 g/dL (ref 13.0–17.0)
MCH: 30.6 pg (ref 26.0–34.0)
MCHC: 33.3 g/dL (ref 30.0–36.0)
MCV: 91.8 fL (ref 80.0–100.0)
Platelets: 385 10*3/uL (ref 150–400)
RBC: 5 MIL/uL (ref 4.22–5.81)
RDW: 13.3 % (ref 11.5–15.5)
WBC: 8.7 10*3/uL (ref 4.0–10.5)
nRBC: 0 % (ref 0.0–0.2)

## 2018-09-18 MED ORDER — ONDANSETRON HCL 4 MG/2ML IJ SOLN
4.0000 mg | Freq: Once | INTRAMUSCULAR | Status: AC
  Administered 2018-09-18: 4 mg via INTRAVENOUS
  Filled 2018-09-18: qty 2

## 2018-09-18 MED ORDER — FENTANYL CITRATE (PF) 100 MCG/2ML IJ SOLN
50.0000 ug | INTRAMUSCULAR | Status: AC | PRN
  Administered 2018-09-18 – 2018-09-19 (×2): 50 ug via INTRAVENOUS
  Filled 2018-09-18 (×2): qty 2

## 2018-09-18 NOTE — ED Triage Notes (Signed)
Pt arrives with complaints of right flank pain that started 2 day prior. Pt very uncomfortable in triage. Hx of kidney stones.

## 2018-09-19 ENCOUNTER — Emergency Department
Admission: EM | Admit: 2018-09-19 | Discharge: 2018-09-19 | Disposition: A | Discharge status: Home / Self Care | Payer: BC Managed Care – PPO | Attending: Emergency Medicine | Admitting: Emergency Medicine

## 2018-09-19 ENCOUNTER — Other Ambulatory Visit: Payer: Self-pay

## 2018-09-19 ENCOUNTER — Emergency Department: Payer: BC Managed Care – PPO

## 2018-09-19 DIAGNOSIS — N132 Hydronephrosis with renal and ureteral calculous obstruction: Secondary | ICD-10-CM | POA: Diagnosis not present

## 2018-09-19 DIAGNOSIS — N23 Unspecified renal colic: Secondary | ICD-10-CM

## 2018-09-19 DIAGNOSIS — N200 Calculus of kidney: Secondary | ICD-10-CM

## 2018-09-19 DIAGNOSIS — K760 Fatty (change of) liver, not elsewhere classified: Secondary | ICD-10-CM | POA: Diagnosis not present

## 2018-09-19 LAB — URINALYSIS, COMPLETE (UACMP) WITH MICROSCOPIC
Bacteria, UA: NONE SEEN
Bilirubin Urine: NEGATIVE
Glucose, UA: NEGATIVE mg/dL
Ketones, ur: NEGATIVE mg/dL
Leukocytes,Ua: NEGATIVE
Nitrite: NEGATIVE
Protein, ur: NEGATIVE mg/dL
Specific Gravity, Urine: 1.014 (ref 1.005–1.030)
Squamous Epithelial / LPF: NONE SEEN (ref 0–5)
pH: 5 (ref 5.0–8.0)

## 2018-09-19 MED ORDER — SODIUM CHLORIDE 0.9 % IV BOLUS
1000.0000 mL | Freq: Once | INTRAVENOUS | Status: AC
  Administered 2018-09-19: 01:00:00 1000 mL via INTRAVENOUS

## 2018-09-19 MED ORDER — KETOROLAC TROMETHAMINE 30 MG/ML IJ SOLN: 15.0000 mg | Freq: Once | INTRAMUSCULAR | Status: DC

## 2018-09-19 MED ORDER — ONDANSETRON 4 MG PO TBDP: 4.0000 mg | ORAL_TABLET | Freq: Three times a day (TID) | ORAL | 0 refills | Status: DC | PRN

## 2018-09-19 MED ORDER — TAMSULOSIN HCL 0.4 MG PO CAPS: 0.4000 mg | ORAL_CAPSULE | Freq: Every day | ORAL | 0 refills | Status: AC

## 2018-09-19 MED ORDER — IBUPROFEN 600 MG PO TABS: 600.0000 mg | ORAL_TABLET | Freq: Four times a day (QID) | ORAL | 0 refills | Status: DC | PRN

## 2018-09-19 MED ORDER — TAMSULOSIN HCL 0.4 MG PO CAPS: 0.4000 mg | ORAL_CAPSULE | Freq: Once | ORAL | Status: DC

## 2018-09-19 MED ORDER — OXYCODONE-ACETAMINOPHEN 5-325 MG PO TABS: 1.0000 | ORAL_TABLET | ORAL | 0 refills | Status: DC | PRN

## 2018-09-19 NOTE — Discharge Instructions (Signed)

## 2018-09-19 NOTE — ED Notes (Signed)
Pt d/c pain free.

## 2018-09-19 NOTE — ED Notes (Signed)
Pt to the er for right side flank pain that began yesterday. Pt states he has had kidney stones twice before and the pain is consistent with that. Pt States he urinated just PTA. Pt has been unable to give urine at this time. Pt received pain meds at that time and pt states it did help.

## 2018-09-19 NOTE — ED Provider Notes (Signed)
[facility] Emergency Department Provider Note  ____________________________________________  Time seen: Approximately 1:52 AM  I have reviewed the triage vital signs and the nursing notes.   HISTORY  Chief Complaint Flank Pain   HPI Eric Blevins is a 53 y.o. male with history of kidney stones, hypertension, stage I chronic kidney disease, hyperlipidemia, sleep apnea who presents for evaluation of right flank pain.  The symptoms started last night.  He complains of a sharp right flank pain radiating down to the right lower part of his abdomen associated with nausea.  Pain is similar to prior episodes of kidney stones.  No dysuria or hematuria, no chest pain or shortness of breath, patient has had nausea but no vomiting, no diarrhea or constipation.  No prior abdominal surgeries.   Past Medical History:  Diagnosis Date   Allergy    Benign hypertensive renal disease    Chronic kidney disease, stage I    ED (erectile dysfunction)    Hyperlipidemia    Hypertension    Hypogonadism in male    Obesity    Sleep apnea    Thrombocytosis [facility])     Patient Active Problem List   Diagnosis Date Noted   Depression, major, single episode, moderate ([place]) 09/28/2016   Hypogonadism in male    Benign hypertensive renal disease    Chronic kidney disease, stage I    ED (erectile dysfunction)    Sleep apnea 05/11/2010   Hyperlipidemia 04/28/2008   Morbid obesity ([place]) 02/25/2008    Past Surgical History:  Procedure Laterality Date   COLONOSCOPY WITH PROPOFOL N/A 12/17/2017   Procedure: COLONOSCOPY WITH PROPOFOL;  Surgeon: Lin Landsman, MD;  Location: [place];  Service: Gastroenterology;  Laterality: N/A;   LUMBAR DISC SURGERY  1990   l4/5     Prior to Admission medications   Medication Sig Start Date End Date Taking? Authorizing Provider  amLODipine (NORVASC) 5 MG tablet TAKE 1 TABLET (5 MG TOTAL) BY MOUTH DAILY. 05/16/18    Johnson, Megan P, DO  ibuprofen (ADVIL) 600 MG tablet Take 1 tablet (600 mg total) by mouth every 6 (six) hours as needed. 09/19/18   Rudene Re, MD  lisinopril (PRINIVIL,ZESTRIL) 2.5 MG tablet Take 1 tablet (2.5 mg total) by mouth daily. 05/16/18   Johnson, Megan P, DO  loratadine (CLARITIN) 10 MG tablet Take 10 mg by mouth daily.    [provider]  ondansetron (ZOFRAN ODT) 4 MG disintegrating tablet Take 1 tablet (4 mg total) by mouth every 8 (eight) hours as needed. 09/19/18   Rudene Re, MD  oxyCODONE-acetaminophen (PERCOCET) 5-325 MG tablet Take 1 tablet by mouth every 4 (four) hours as needed. 09/19/18   Rudene Re, MD  sertraline (ZOLOFT) 100 MG tablet Take 1 tablet (100 mg total) by mouth daily. 05/16/18   Johnson, Megan P, DO  tamsulosin (FLOMAX) 0.4 MG CAPS capsule Take 1 capsule (0.4 mg total) by mouth daily for 7 days. 09/19/18 09/26/18  Rudene Re, MD  testosterone (ANDROGEL) 50 MG/5GM (1%) GEL Place 5 g onto the skin daily. 05/20/18   Valerie Roys, DO    Allergies Patient has no known allergies.  Family History  Problem Relation Age of Onset   Hypertension Father    Hyperlipidemia Father    Diabetes Mother    Cancer Maternal Grandmother    Cancer Maternal Grandfather    Diabetes Paternal Grandmother    Diabetes Brother    Thyroid disease Brother  Social History Social History   Tobacco Use   Smoking status: Never Smoker   Smokeless tobacco: Never Used  Substance Use Topics   Alcohol use: No   Drug use: No    Review of Systems  Constitutional: Negative for fever. Eyes: Negative for visual changes. ENT: Negative for sore throat. Neck: No neck pain  Cardiovascular: Negative for chest pain. Respiratory: Negative for shortness of breath. Gastrointestinal: Negative for abdominal pain, vomiting or diarrhea. + nausea Genitourinary: Negative for dysuria. + R flank pain Musculoskeletal: Negative for back  pain. Skin: Negative for rash. Neurological: Negative for headaches, weakness or numbness. Psych: No SI or HI  ____________________________________________   PHYSICAL EXAM:  VITAL SIGNS: ED Triage Vitals [09/18/18 2228]  Enc Vitals Group     BP (!) 157/97     Pulse Rate 95     Resp 16     Temp 98.7 F (37.1 C)     Temp Source Oral     SpO2 96 %     Weight 250 lb (113.4 kg)     Height 5\' 9"  (1.753 m)     Head Circumference      Peak Flow      Pain Score 10     Pain Loc      Pain Edu?      Excl. in [place]?     Constitutional: Alert and oriented. Well appearing and in no apparent distress. HEENT:      Head: Normocephalic and atraumatic.         Eyes: Conjunctivae are normal. Sclera is non-icteric.       Mouth/Throat: Mucous membranes are moist.       Neck: Supple with no signs of meningismus. Cardiovascular: Regular rate and rhythm. No murmurs, gallops, or rubs. 2+ symmetrical distal pulses are present in all extremities. No JVD. Respiratory: Normal respiratory effort. Lungs are clear to auscultation bilaterally. No wheezes, crackles, or rhonchi.  Gastrointestinal: Soft, non tender, and non distended with positive bowel sounds. No rebound or guarding. Genitourinary: R CVA tenderness. Musculoskeletal: Nontender with normal range of motion in all extremities. No edema, cyanosis, or erythema of extremities. Neurologic: Normal speech and language. Face is symmetric. Moving all extremities. No gross focal neurologic deficits are appreciated. Skin: Skin is warm, dry and intact. No rash noted. Psychiatric: Mood and affect are normal. Speech and behavior are normal.  ____________________________________________   LABS (all labs ordered are listed, but only abnormal results are displayed)  Labs Reviewed  URINALYSIS, COMPLETE (UACMP) WITH MICROSCOPIC - Abnormal; Notable for the following components:      Result Value   Color, Urine YELLOW (*)    APPearance CLEAR (*)    Hgb urine  dipstick SMALL (*)    All other components within normal limits  BASIC METABOLIC PANEL - Abnormal; Notable for the following components:   Glucose, Bld 112 (*)    Creatinine, Ser 1.47 (*)    GFR calc non Af Amer 54 (*)    All other components within normal limits  CBC   ____________________________________________  EKG  none  ____________________________________________  RADIOLOGY  I have personally reviewed the images performed during this visit and I agree with the Radiologist's read.   Interpretation by Radiologist:  Ct Renal Stone Study  Result Date: 09/19/2018 CLINICAL DATA:  Right flank pain x2 days, history of kidney stones EXAM: CT ABDOMEN AND PELVIS WITHOUT CONTRAST TECHNIQUE: Multidetector CT imaging of the abdomen and pelvis was performed following the standard protocol without  IV contrast. COMPARISON:  09/21/2011 FINDINGS: Lower chest: Mild eventration of the right hemidiaphragm. Associated right lower lobe atelectasis. Hepatobiliary: Mild hepatic steatosis. Gallbladder is unremarkable. No intrahepatic or extrahepatic ductal dilatation. Pancreas: Within normal limits. Spleen: Within normal limits. Adrenals/Urinary Tract: Adrenal glands are within normal limits. 2 mm nonobstructing left upper pole renal calculus (series 2/image 45). Right kidney is within normal limits. Mild right hydronephrosis. Associated 3 mm distal right ureteral calculus just above the UVJ (coronal image 95). Bladder is within normal limits. Stomach/Bowel: Stomach is within normal limits. No evidence of bowel obstruction. Normal appendix (series 2/image 67). Mild sigmoid diverticulosis, without evidence of diverticulitis. Vascular/Lymphatic: No evidence of abdominal aortic aneurysm. No suspicious abdominopelvic lymphadenopathy. Reproductive: Prostate is unremarkable. Other: No abdominopelvic ascites. Tiny fat containing right inguinal hernia (series 2/image 96). Musculoskeletal: Degenerative changes of the  visualized thoracolumbar spine. IMPRESSION: 3 mm distal right ureteral calculus just above the UVJ. Associated mild right hydronephrosis. Additional 2 mm nonobstructing left upper pole renal calculus. Mild hepatic steatosis. Electronically Signed   By: Julian Hy M.D.   On: 09/19/2018 02:38      ____________________________________________   PROCEDURES  Procedure(s) performed: None Procedures Critical Care performed:  None ____________________________________________   INITIAL IMPRESSION / ASSESSMENT AND PLAN / ED COURSE   53 y.o. male with history of kidney stones, hypertension, stage I chronic kidney disease, hyperlipidemia, sleep apnea who presents for evaluation of right flank pain.  Patient is well-appearing in no distress with normal vitals.  Labs showing mild elevated creatinine from 1.1-1.47.  Patient received a liter of fluid.  UA showed no evidence of overlying infection. CT renal confirms a 3 mm distal ureteral stone.  Pain was well controlled and patient was discharged home with increase oral hydration, pain control, and follow-up with urology.  Discussed my standard return precautions.       As part of my medical decision making, I reviewed the following data within the [facility] notes reviewed and incorporated, Labs reviewed , Old chart reviewed, Radiograph reviewed , Notes from prior ED visits and [place] Controlled Substance Database   Patient was evaluated in Emergency Department today for the symptoms described in the history of present illness. Patient was evaluated in the context of the global COVID-19 pandemic, which necessitated consideration that the patient might be at risk for infection with the SARS-CoV-2 virus that causes COVID-19. Institutional protocols and algorithms that pertain to the evaluation of patients at risk for COVID-19 are in a state of rapid change based on information released by regulatory bodies including the CDC and  federal and state organizations. These policies and algorithms were followed during the patient's care in the ED.   ____________________________________________   FINAL CLINICAL IMPRESSION(S) / ED DIAGNOSES   Final diagnoses:  Ureteral colic  Kidney stone      NEW MEDICATIONS STARTED DURING THIS VISIT:  ED Discharge Orders         Ordered    ondansetron (ZOFRAN ODT) 4 MG disintegrating tablet  Every 8 hours PRN     09/19/18 0243    oxyCODONE-acetaminophen (PERCOCET) 5-325 MG tablet  Every 4 hours PRN,   Status:  Discontinued     09/19/18 0243    ibuprofen (ADVIL) 600 MG tablet  Every 6 hours PRN     09/19/18 0243    tamsulosin (FLOMAX) 0.4 MG CAPS capsule  Daily     09/19/18 0243    oxyCODONE-acetaminophen (PERCOCET) 5-325 MG tablet  Every 4 hours  PRN     09/19/18 0244           Note:  This document was prepared using Dragon voice recognition software and may include unintentional dictation errors.    Rudene Re, MD 09/19/18 [phone]

## 2018-09-19 NOTE — ED Notes (Signed)
Liter of fluid hung to help pt produce urine specimen. Pt states his pain is returning. meds given

## 2018-10-09 DIAGNOSIS — Z20828 Contact with and (suspected) exposure to other viral communicable diseases: Secondary | ICD-10-CM | POA: Diagnosis not present

## 2018-11-10 ENCOUNTER — Other Ambulatory Visit: Payer: Self-pay | Admitting: Family Medicine

## 2018-11-11 NOTE — Telephone Encounter (Signed)
Needs appointment

## 2018-11-11 NOTE — Telephone Encounter (Signed)
Routing to provider  

## 2018-11-11 NOTE — Telephone Encounter (Signed)
Requested medication (s) are due for refill today: yes  Requested medication (s) are on the active medication list: yes  Last refill: 08/11/2018  Future visit scheduled: no  Notes to clinic:  Review for refill   Requested Prescriptions  Pending Prescriptions Disp Refills   amLODipine (NORVASC) 5 MG tablet [Pharmacy Med Name: AMLODIPINE BESYLATE 5 MG TAB] 90 tablet 1    Sig: TAKE 1 TABLET BY MOUTH EVERY DAY     Cardiovascular:  Calcium Channel Blockers Failed - 11/10/2018  2:45 AM      Failed - Last BP in normal range    BP Readings from Last 1 Encounters:  09/19/18 (!) 147/98         Passed - Valid encounter within last 6 months    Recent Outpatient Visits          5 months ago Benign hypertensive renal disease   [facility] [place], [place], DO   1 year ago Benign hypertensive renal disease   [facility] Johnson, Megan P, DO   1 year ago Depression, major, single episode, moderate ([place])   [place], [place], DO   2 years ago Routine general medical examination at a health care facility   [place], [place], DO   3 years ago Routine general medical examination at a health care facility   [facility], [place], DO

## 2018-11-12 ENCOUNTER — Encounter: Payer: Self-pay | Admitting: Family Medicine

## 2018-11-12 NOTE — Telephone Encounter (Signed)
Called pt no vm set up. Will mail letter to pt.

## 2018-12-04 ENCOUNTER — Other Ambulatory Visit: Payer: Self-pay | Admitting: Family Medicine

## 2018-12-26 ENCOUNTER — Ambulatory Visit (INDEPENDENT_AMBULATORY_CARE_PROVIDER_SITE_OTHER): Payer: BC Managed Care – PPO | Admitting: Family Medicine

## 2018-12-26 ENCOUNTER — Other Ambulatory Visit: Payer: Self-pay

## 2018-12-26 ENCOUNTER — Encounter: Payer: Self-pay | Admitting: Family Medicine

## 2018-12-26 DIAGNOSIS — E782 Mixed hyperlipidemia: Secondary | ICD-10-CM

## 2018-12-26 DIAGNOSIS — E291 Testicular hypofunction: Secondary | ICD-10-CM

## 2018-12-26 DIAGNOSIS — F321 Major depressive disorder, single episode, moderate: Secondary | ICD-10-CM | POA: Diagnosis not present

## 2018-12-26 DIAGNOSIS — Z125 Encounter for screening for malignant neoplasm of prostate: Secondary | ICD-10-CM

## 2018-12-26 DIAGNOSIS — N181 Chronic kidney disease, stage 1: Secondary | ICD-10-CM | POA: Diagnosis not present

## 2018-12-26 DIAGNOSIS — I129 Hypertensive chronic kidney disease with stage 1 through stage 4 chronic kidney disease, or unspecified chronic kidney disease: Secondary | ICD-10-CM | POA: Diagnosis not present

## 2018-12-26 DIAGNOSIS — Z114 Encounter for screening for human immunodeficiency virus [HIV]: Secondary | ICD-10-CM

## 2018-12-26 DIAGNOSIS — G5600 Carpal tunnel syndrome, unspecified upper limb: Secondary | ICD-10-CM

## 2018-12-26 MED ORDER — LISINOPRIL 2.5 MG PO TABS: 2.5000 mg | ORAL_TABLET | Freq: Every day | ORAL | 1 refills | Status: DC

## 2018-12-26 MED ORDER — SERTRALINE HCL 100 MG PO TABS: 100.0000 mg | ORAL_TABLET | Freq: Every day | ORAL | 1 refills | Status: DC

## 2018-12-26 MED ORDER — AMLODIPINE BESYLATE 5 MG PO TABS: ORAL_TABLET | ORAL | 1 refills | Status: DC

## 2018-12-26 MED ORDER — TESTOSTERONE 50 MG/5GM (1%) TD GEL: 5.0000 g | Freq: Every day | TRANSDERMAL | 5 refills | Status: DC

## 2018-12-26 NOTE — Progress Notes (Signed)
There were no vitals taken for this visit.   Subjective:    Patient ID: Eric Blevins, male    DOB: 03/08/1965, 53 y.o.   MRN: [id number]  HPI: Eric Blevins is a 54 y.o. male  Chief Complaint  Patient presents with  . Depression  . Hypertension   DEPRESSION Mood status: controlled Satisfied with current treatment?: yes Symptom severity: mild  Duration of current treatment : months Side effects: no Medication compliance: excellent compliance Psychotherapy/counseling: no  Previous psychiatric medications: sertraline Depressed mood: no Anxious mood: no Anhedonia: no Significant weight loss or gain: no Insomnia: no  Fatigue: no Feelings of worthlessness or guilt: no Impaired concentration/indecisiveness: no Suicidal ideations: no Hopelessness: no Crying spells: no Depression screen [facility] 2/9 12/26/2018 05/16/2018 11/08/2017 09/28/2016 09/28/2015  Decreased Interest 0 0 0 3 0  Down, Depressed, Hopeless 0 0 0 3 0  PHQ - 2 Score 0 0 0 6 0  Altered sleeping 0 0 0 0 -  Tired, decreased energy 1 0 0 3 -  Change in appetite 0 0 0 2 -  Feeling bad or failure about yourself  0 0 0 0 -  Trouble concentrating 0 0 0 2 -  Moving slowly or fidgety/restless 0 0 0 0 -  Suicidal thoughts 0 0 0 0 -  PHQ-9 Score 1 0 0 13 -  Difficult doing work/chores Not difficult at all - Not difficult at all - -   HYPERTENSION / [place] Satisfied with current treatment? yes Duration of hypertension: chronic BP monitoring frequency: not checking BP medication side effects: no Past BP meds: amlodipine, lisinopril Duration of hyperlipidemia: chronic Cholesterol medication side effects: not on anything Aspirin: no Recent stressors: no Recurrent headaches: no Visual changes: no Palpitations: no Dyspnea: no Chest pain: no Lower extremity edema: no Dizzy/lightheaded: no  LOW TESTOSTERONE Duration: chronic Status: uncontrolled  Satisfied with current treatment:  no Previous  testosterone therapies: androgel Medication side effects:  no Medication compliance: fair compliance Decreased libido: yes Fatigue: yes Depressed mood: yes Muscle weakness: no Erectile dysfunction: no  Has been having pain in his wrist and numbness in his fingers and hands that wake him up at night.   Relevant past medical, surgical, family and social history reviewed and updated as indicated. Interim medical history since our last visit reviewed. Allergies and medications reviewed and updated.  Review of Systems  Constitutional: Negative.   Respiratory: Negative.   Cardiovascular: Negative.   Musculoskeletal: Positive for myalgias. Negative for arthralgias, back pain, gait problem, joint swelling, neck pain and neck stiffness.  Skin: Negative.   Neurological: Positive for weakness and numbness. Negative for dizziness, tremors, seizures, syncope, facial asymmetry, speech difficulty, light-headedness and headaches.  Hematological: Negative.   Psychiatric/Behavioral: Negative.     Per HPI unless specifically indicated above     Objective:    There were no vitals taken for this visit.  Wt Readings from Last 3 Encounters:  09/18/18 250 lb (113.4 kg)  05/16/18 250 lb (113.4 kg)  12/17/17 245 lb (111.1 kg)    Physical Exam Vitals signs and nursing note reviewed.  Constitutional:      General: He is not in acute distress.    Appearance: Normal appearance. He is not ill-appearing, toxic-appearing or diaphoretic.  HENT:     Head: Normocephalic and atraumatic.     Right Ear: External ear normal.     Left Ear: External ear normal.     Nose: Nose normal.  Mouth/Throat:     Mouth: Mucous membranes are moist.     Pharynx: Oropharynx is clear.  Eyes:     General: No scleral icterus.       Right eye: No discharge.        Left eye: No discharge.     Conjunctiva/sclera: Conjunctivae normal.     Pupils: Pupils are equal, round, and reactive to light.  Neck:      Musculoskeletal: Normal range of motion.  Pulmonary:     Effort: Pulmonary effort is normal. No respiratory distress.     Comments: Speaking in full sentences Musculoskeletal: Normal range of motion.  Skin:    Coloration: Skin is not jaundiced or pale.     Findings: No bruising, erythema, lesion or rash.  Neurological:     Mental Status: He is alert and oriented to person, place, and time. Mental status is at baseline.  Psychiatric:        Mood and Affect: Mood normal.        Behavior: Behavior normal.        Thought Content: Thought content normal.        Judgment: Judgment normal.     Results for orders placed or performed during the hospital encounter of 09/19/18  Urinalysis, Complete w Microscopic  Result Value Ref Range   Color, Urine YELLOW (A) YELLOW   APPearance CLEAR (A) CLEAR   Specific Gravity, Urine 1.014 1.005 - 1.030   pH 5.0 5.0 - 8.0   Glucose, UA NEGATIVE NEGATIVE mg/dL   Hgb urine dipstick SMALL (A) NEGATIVE   Bilirubin Urine NEGATIVE NEGATIVE   Ketones, ur NEGATIVE NEGATIVE mg/dL   Protein, ur NEGATIVE NEGATIVE mg/dL   Nitrite NEGATIVE NEGATIVE   Leukocytes,Ua NEGATIVE NEGATIVE   RBC / HPF 0-5 0 - 5 RBC/hpf   WBC, UA 0-5 0 - 5 WBC/hpf   Bacteria, UA NONE SEEN NONE SEEN   Squamous Epithelial / LPF NONE SEEN 0 - 5  CBC  Result Value Ref Range   WBC 8.7 4.0 - 10.5 K/uL   RBC 5.00 4.22 - 5.81 MIL/uL   Hemoglobin 15.3 13.0 - 17.0 g/dL   HCT 45.9 39.0 - 52.0 %   MCV 91.8 80.0 - 100.0 fL   MCH 30.6 26.0 - 34.0 pg   MCHC 33.3 30.0 - 36.0 g/dL   RDW 13.3 11.5 - 15.5 %   Platelets 385 150 - 400 K/uL   nRBC 0.0 0.0 - 0.2 %  Basic metabolic panel  Result Value Ref Range   Sodium 137 135 - 145 mmol/L   Potassium 4.2 3.5 - 5.1 mmol/L   Chloride 104 98 - 111 mmol/L   CO2 25 22 - 32 mmol/L   Glucose, Bld 112 (H) 70 - 99 mg/dL   BUN 17 6 - 20 mg/dL   Creatinine, Ser 1.47 (H) 0.61 - 1.24 mg/dL   Calcium 8.9 8.9 - 10.3 mg/dL   GFR calc non Af Amer 54 (L) >60  mL/min   GFR calc Af Amer >60 >60 mL/min   Anion gap 8 5 - 15      Assessment & Plan:   Problem List Items Addressed This Visit      Endocrine   Hypogonadism in male    Stopped his testosterone- will restart and recheck. Continue to monitor.       Relevant Orders   PSA   Testosterone, free, total([facility])     Genitourinary   Benign hypertensive renal disease -  Primary    Will get BP checked when he comes in or his labs. Feeling well now. Continue current regimen. Continue to monitor. Call with any concerns. Refills given. Labs to be drawn ASAP.      Relevant Orders   Comp Met (CMET)   CBC with Differential OUT   Microalbumin, Urine Waived   TSH   UA/M w/rflx Culture, Routine   Chronic kidney disease, stage I    Will get labs checked ASAP. Await results.       Relevant Orders   Comp Met (CMET)   CBC with Differential OUT   TSH   UA/M w/rflx Culture, Routine     Other   Hyperlipidemia    Rechecking labs today. Await results. Call with any concerns.       Relevant Medications   amLODipine (NORVASC) 5 MG tablet   lisinopril (ZESTRIL) 2.5 MG tablet   Other Relevant Orders   Comp Met (CMET)   Lipid Panel w/o Chol/HDL Ratio OUT   CBC with Differential OUT   TSH   UA/M w/rflx Culture, Routine   Depression, major, single episode, moderate ([facility])    Under good control on current regimen. Continue current regimen. Continue to monitor. Call with any concerns. Refills given.        Relevant Medications   sertraline (ZOLOFT) 100 MG tablet   Other Relevant Orders   Comp Met (CMET)   CBC with Differential OUT   TSH   UA/M w/rflx Culture, Routine    Other Visit Diagnoses    Carpal tunnel syndrome, unspecified laterality       Will start exercises and brace, If not getting better- let us know, and we will refer to orthopedics. Call with any concerns.    Relevant Medications   sertraline (ZOLOFT) 100 MG tablet   Screening for prostate cancer       Labs to  be drawn ASAP.   Relevant Orders   PSA   Screening for HIV without presence of risk factors       Labs to be drawn ASAP.   Relevant Orders   HIV antibody (with reflex)       Follow up plan: Return in about 6 months (around 06/25/2019) for Physical.   . This visit was completed via Doximity due to the restrictions of the COVID-19 pandemic. All issues as above were discussed and addressed. Physical exam was done as above through visual confirmation on Doximity. If it was felt that the patient should be evaluated in the office, they were directed there. The patient verbally consented to this visit. . Location of the patient: home . Location of the provider: home . Those involved with this call:  . Provider: Park Liter, DO . CMA: Yvonna Alanis, [place] . Front Desk/Registration: Don Perking  . Time spent on call: 25 minutes with patient face to face via video conference. More than 50% of this time was spent in counseling and coordination of care. 40 minutes total spent in review of patient's record and preparation of their chart.

## 2018-12-26 NOTE — Assessment & Plan Note (Signed)
Rechecking labs today. Await results. Call with any concerns.  

## 2018-12-26 NOTE — Assessment & Plan Note (Signed)
Under good control on current regimen. Continue current regimen. Continue to monitor. Call with any concerns. Refills given.   

## 2018-12-26 NOTE — Assessment & Plan Note (Signed)
Will get BP checked when he comes in or his labs. Feeling well now. Continue current regimen. Continue to monitor. Call with any concerns. Refills given. Labs to be drawn ASAP.

## 2018-12-26 NOTE — Assessment & Plan Note (Signed)
Will get labs checked ASAP. Await results.

## 2018-12-26 NOTE — Assessment & Plan Note (Signed)
Stopped his testosterone- will restart and recheck. Continue to monitor.

## 2018-12-26 NOTE — Patient Instructions (Signed)
Carpal Tunnel Syndrome  Carpal tunnel syndrome is a condition that causes pain in your hand and arm. The carpal tunnel is a narrow area located on the palm side of your wrist. Repeated wrist motion or certain diseases may cause swelling within the tunnel. This swelling pinches the main nerve in the wrist (median nerve). What are the causes? This condition may be caused by:  Repeated wrist motions.  Wrist injuries.  Arthritis.  A cyst or tumor in the carpal tunnel.  Fluid buildup during pregnancy. Sometimes the cause of this condition is not known. What increases the risk? The following factors may make you more likely to develop this condition:  Having a job, such as being a butcher or a cashier, that requires you to repeatedly move your wrist in the same motion.  Being a woman.  Having certain conditions, such as: ? Diabetes. ? Obesity. ? An underactive thyroid (hypothyroidism). ? Kidney failure. What are the signs or symptoms? Symptoms of this condition include:  A tingling feeling in your fingers, especially in your thumb, index, and middle fingers.  Tingling or numbness in your hand.  An aching feeling in your entire arm, especially when your wrist and elbow are bent for a long time.  Wrist pain that goes up your arm to your shoulder.  Pain that goes down into your palm or fingers.  A weak feeling in your hands. You may have trouble grabbing and holding items. Your symptoms may feel worse during the night. How is this diagnosed? This condition is diagnosed with a medical history and physical exam. You may also have tests, including:  Electromyogram (EMG). This test measures electrical signals sent by your nerves into the muscles.  Nerve conduction study. This test measures how well electrical signals pass through your nerves.  Imaging tests, such as X-rays, ultrasound, and MRI. These tests check for possible causes of your condition. How is this treated? This  condition may be treated with:  Lifestyle changes. It is important to stop or change the activity that caused your condition.  Doing exercise and activities to strengthen your muscles and bones (physical therapy).  Learning how to use your hand again after diagnosis (occupational therapy).  Medicines for pain and inflammation. This may include medicine that is injected into your wrist.  A wrist splint.  Surgery. Follow these instructions at home: If you have a splint:  Wear the splint as told by your health care provider. Remove it only as told by your health care provider.  Loosen the splint if your fingers tingle, become numb, or turn cold and blue.  Keep the splint clean.  If the splint is not waterproof: ? Do not let it get wet. ? Cover it with a watertight covering when you take a bath or shower. Managing pain, stiffness, and swelling   If directed, put ice on the painful area: ? If you have a removable splint, remove it as told by your health care provider. ? Put ice in a plastic bag. ? Place a towel between your skin and the bag. ? Leave the ice on for 20 minutes, 2-3 times per day. General instructions  Take over-the-counter and prescription medicines only as told by your health care provider.  Rest your wrist from any activity that may be causing your pain. If your condition is work related, talk with your employer about changes that can be made, such as getting a wrist pad to use while typing.  Do any exercises as told   by your health care provider, physical therapist, or occupational therapist.  Keep all follow-up visits as told by your health care provider. This is important. Contact a health care provider if:  You have new symptoms.  Your pain is not controlled with medicines.  Your symptoms get worse. Get help right away if:  You have severe numbness or tingling in your wrist or hand. Summary  Carpal tunnel syndrome is a condition that causes pain in  your hand and arm.  It is usually caused by repeated wrist motions.  Lifestyle changes and medicines are used to treat carpal tunnel syndrome. Surgery may be recommended.  Follow your health care provider's instructions about wearing a splint, resting from activity, keeping follow-up visits, and calling for help. This information is not intended to replace advice given to you by your health care provider. Make sure you discuss any questions you have with your health care provider. Document Released: 02/04/2000 Document Revised: 06/15/2017 Document Reviewed: 06/15/2017 Elsevier Patient Education  [place].  Wrist and Forearm Exercises Ask your health care provider which exercises are safe for you. Do exercises exactly as told by your health care provider and adjust them as directed. It is normal to feel mild stretching, pulling, tightness, or discomfort as you do these exercises. Stop right away if you feel sudden pain or your pain gets worse. Do not begin these exercises until told by your health care provider. Range-of-motion exercises These exercises warm up your muscles and joints and improve the movement and flexibility of your injured wrist and forearm. These exercises also help to relieve pain, numbness, and tingling. These exercises are done using the muscles in your injured wrist and forearm. Wrist flexion 1. Bend your left / right elbow to a 90-degree angle (right angle) with your palm facing the floor. 2. Bend your wrist so that your fingers point toward the floor (flexion). 3. Hold this position for __________ seconds. 4. Slowly return to the starting position. Repeat __________ times. Complete this exercise __________ times a day. Wrist extension 1. Bend your left / right elbow to a 90-degree angle (right angle) with your palm facing the floor. 2. Bend your wrist so that your fingers point toward the ceiling (extension). 3. Hold this position for __________ seconds. 4.  Slowly return to the starting position. Repeat __________ times. Complete this exercise __________ times a day. Ulnar deviation 1. Bend your left / right elbow to a 90-degree angle (right angle), and rest your forearm on a table with your palm facing down. 2. Keeping your hand flat on the table, bend your left /right wrist toward your small finger (pinkie). This is ulnar deviation. 3. Hold this position for __________ seconds. 4. Slowly return to the starting position. Repeat __________ times. Complete this exercise __________ times a day. Radial deviation 1. Bend your left / right elbow to a 90-degree angle (right angle), and rest your forearm on a table with your palm facing down. 2. Keeping your hand flat on the table, bend your left /right wrist toward your thumb. This is radial deviation. 3. Hold this position for __________ seconds. 4. Slowly return to the starting position. Repeat __________ times. Complete this exercise __________ times a day. Forearm rotation, supination  1. Sit with your left / right elbow bent to a 90-degree angle (right angle). Position your forearm so that the thumb is facing the ceiling (neutral position). 2. Turn (rotate) your palm up toward the ceiling (supination), stopping when you feel a gentle  stretch. 3. Hold this position for __________ seconds. 4. Slowly return to the starting position. Repeat __________ times. Complete this exercise __________ times a day. Forearm rotation, pronation  1. Sit with your left / right elbow bent to a 90-degree angle (right angle). Position your forearm so that the thumb is facing the ceiling (neutral position). 2. Rotate your palm down toward the floor (pronation), stopping when you feel a gentle stretch. 3. Hold this position for __________ seconds. 4. Slowly return to the starting position. Repeat __________ times. Complete this exercise __________ times a day. Stretching These exercises warm up your muscles and  joints and improve the movement and flexibility of your injured wrist and forearm. These exercises also help to relieve pain, numbness, and tingling. These exercises are done using your healthy wrist and forearm to help stretch the muscles in your injured wrist and forearm. Wrist flexion  1. Extend your left / right arm in front of you, and turn your palm down toward the floor. ? If told by your health care provider, bend your left / right elbow to a 90-degree angle (right angle) at your side. 2. Using your uninjured hand, gently press over the back of your left / right hand to bend your wrist and fingers toward the floor (flexion). Go as far as you can to feel a stretch without causing pain. 3. Hold this position for __________ seconds. 4. Slowly return to the starting position. Repeat __________ times. Complete this exercise __________ times a day. Wrist extension  1. Extend your left / right arm in front of you and turn your palm up toward the ceiling. ? If told by your health care provider, bend your left / right elbow to a 90-degree angle (right angle) at your side. 2. Using your uninjured hand, gently press over the palm of your left / right hand to bend your wrist and fingers toward the floor (extension). Go as far as you can to feel a stretch without causing pain. 3. Hold this position for __________ seconds. 4. Slowly return to the starting position. Repeat __________ times. Complete this exercise __________ times a day. Forearm rotation, supination 1. Sit with your left / right elbow bent to a 90-degree angle (right angle). Position your forearm so that the thumb is facing the ceiling (neutral position). 2. Rotate your palm up toward the ceiling as far as you can on your own (supination). Then, use your uninjured hand to help turn your forearm more, stopping when you feel a gentle stretch. 3. Hold this position for __________ seconds. 4. Slowly return to the starting position. Repeat  __________ times. Complete this exercise __________ times a day. Forearm rotation, pronation 1. Sit with your left / right elbow bent to a 90-degree angle (right angle). Position your forearm so that the thumb is facing the ceiling (neutral position). 2. Rotate your palm down toward the floor as far as you can on your own (pronation). Then, use your uninjured hand to help turn your forearm more, stopping when you feel a gentle stretch. 3. Hold this position for __________ seconds. 4. Slowly return to the starting position. Repeat __________ times. Complete this exercise __________ times a day. Strengthening exercises These exercises build strength and endurance in your wrist and forearm. Endurance is the ability to use your muscles for a long time, even after they get tired. Wrist flexion  1. Sit with your left / right forearm supported on a table or other surface. Bend your elbow to a  90-degree angle (right angle), and rest your hand palm-up over the edge of the table. 2. Hold a __________ weight in your left / right hand. Or, hold an exercise band or tube in both hands, keeping your hands at the same level and hip distance apart. There should be a slight tension in the exercise band or tube. 3. Slowly curl your hand up toward the ceiling (flexion). 4. Hold this position for __________ seconds. 5. Slowly lower your hand back to the starting position. Repeat __________ times. Complete this exercise __________ times a day. Wrist extension  1. Sit with your left / right forearm supported on a table or other surface. Bend your elbow to a 90-degree angle (right angle), and rest your hand palm-down over the edge of the table. 2. Hold a __________ weight in your left / right hand. Or, hold an exercise band or tube in both hands, keeping your hands at the same level and hip distance apart. There should be a slight tension in the exercise band or tube. 3. Slowly curl your hand up toward the ceiling  (extension). 4. Hold this position for __________ seconds. 5. Slowly lower your hand back to the starting position. Repeat __________ times. Complete this exercise __________ times a day. Forearm rotation, supination  1. Sit with your left / right forearm supported on a table or other surface. Bend your elbow to a 90-degree angle (right angle). Position your forearm so that your thumb is facing the ceiling (neutral position) and your hand is resting over the edge of the table. 2. Hold a hammer in your left / right hand. ? This exercise will be easier if you hold the hammer near the head of the hammer. ? This exercise will be harder if you hold the hammer near the end of the handle. 3. Without moving your elbow, slowly rotate your palm up toward the ceiling (supination). 4. Hold this position for __________ seconds. 5. Slowly return to the starting position. Repeat __________ times. Complete this exercise __________ times a day. Forearm rotation, pronation  1. Sit with your left / right forearm supported on a table or other surface. Bend your elbow to a 90-degree angle (right angle). Position your forearm so that the thumb is facing the ceiling (neutral position), with your hand resting over the edge of the table. 2. Hold a hammer in your left / right hand. ? This exercise will be easier if you hold the hammer near the head of the hammer. ? This exercise will be harder if you hold the hammer near the end of the handle. 3. Without moving your elbow, slowly rotate your palm down toward the floor (pronation). 4. Hold this position for __________ seconds. 5. Slowly return to the starting position. Repeat __________ times. Complete this exercise __________ times a day. Grip strengthening  1. Grasp a stress ball or other ball in the middle of your left / right hand. Start with your elbow bent to a 90-degree angle (right angle). 2. Slowly increase the pressure, squeezing the ball as hard as you  can without causing pain. ? Think of bringing the tips of your fingers into the middle of your palm. All of your finger joints should bend when doing this exercise. ? To make this exercise harder, gradually try to straighten your elbow in front of you, until you can do the exercise with your elbow fully straight. 3. Hold your squeeze for __________ seconds, then relax. If instructed by your health care provider,  do this exercise: ? With your forearm positioned so that the thumb is facing the ceiling (neutral position). ? With your forearm turned palm down. ? With your forearm turned palm up. Repeat __________ times. Complete this exercise __________ times a day. This information is not intended to replace advice given to you by your health care provider. Make sure you discuss any questions you have with your health care provider. Document Released: 12/21/2004 Document Revised: 03/28/2018 Document Reviewed: 03/28/2018 Elsevier Patient Education  [place].

## 2019-02-26 ENCOUNTER — Other Ambulatory Visit: Payer: BC Managed Care – PPO

## 2019-06-23 ENCOUNTER — Other Ambulatory Visit: Payer: Self-pay | Admitting: Family Medicine

## 2019-06-26 ENCOUNTER — Other Ambulatory Visit: Payer: Self-pay | Admitting: Family Medicine

## 2019-07-11 ENCOUNTER — Encounter: Payer: BC Managed Care – PPO | Admitting: Family Medicine

## 2019-07-19 ENCOUNTER — Other Ambulatory Visit: Payer: Self-pay | Admitting: Family Medicine

## 2019-07-19 NOTE — Telephone Encounter (Signed)
Requested Prescriptions  Pending Prescriptions Disp Refills  . lisinopril (ZESTRIL) 2.5 MG tablet [Pharmacy Med Name: LISINOPRIL 2.5 MG TABLET] 30 tablet 0    Sig: TAKE 1 TABLET BY MOUTH EVERY DAY     Cardiovascular:  ACE Inhibitors Failed - 07/19/2019 12:51 AM      Failed - Cr in normal range and within 180 days    Creatinine  Date Value Ref Range Status  09/21/2011 1.35 (H) 0.60 - 1.30 mg/dL Final   Creatinine, Ser  Date Value Ref Range Status  09/18/2018 1.47 (H) 0.61 - 1.24 mg/dL Final         Failed - K in normal range and within 180 days    Potassium  Date Value Ref Range Status  09/18/2018 4.2 3.5 - 5.1 mmol/L Final    Comment:    HEMOLYSIS AT THIS LEVEL MAY AFFECT RESULT  09/21/2011 3.0 (L) 3.5 - 5.1 mmol/L Final         Failed - Last BP in normal range    BP Readings from Last 1 Encounters:  09/19/18 (!) 147/98         Failed - Valid encounter within last 6 months    Recent Outpatient Visits          6 months ago Benign hypertensive renal disease   [facility] O[place], Megan P, DO   1 year ago Benign hypertensive renal disease   [facility] [place], Megan P, DO   1 year ago Benign hypertensive renal disease   [facility] Johnson, Megan P, DO   2 years ago Depression, major, single episode, moderate ([place])   [place], [place], DO   2 years ago Routine general medical examination at a health care facility   [place], Barb Merino, DO      Future Appointments            In 5 days Valerie Roys, DO [place], [place] - Patient is not pregnant

## 2019-07-24 ENCOUNTER — Encounter: Payer: Self-pay | Admitting: Family Medicine

## 2019-07-24 ENCOUNTER — Ambulatory Visit (INDEPENDENT_AMBULATORY_CARE_PROVIDER_SITE_OTHER): Payer: BC Managed Care – PPO | Admitting: Family Medicine

## 2019-07-24 ENCOUNTER — Other Ambulatory Visit: Payer: Self-pay

## 2019-07-24 VITALS — BP 139/89 | HR 72 | Temp 97.6°F | Ht 68.11 in | Wt 273.2 lb

## 2019-07-24 DIAGNOSIS — Z0000 Encounter for general adult medical examination without abnormal findings: Secondary | ICD-10-CM

## 2019-07-24 DIAGNOSIS — F321 Major depressive disorder, single episode, moderate: Secondary | ICD-10-CM

## 2019-07-24 DIAGNOSIS — I129 Hypertensive chronic kidney disease with stage 1 through stage 4 chronic kidney disease, or unspecified chronic kidney disease: Secondary | ICD-10-CM

## 2019-07-24 DIAGNOSIS — E782 Mixed hyperlipidemia: Secondary | ICD-10-CM

## 2019-07-24 DIAGNOSIS — E291 Testicular hypofunction: Secondary | ICD-10-CM | POA: Diagnosis not present

## 2019-07-24 DIAGNOSIS — N181 Chronic kidney disease, stage 1: Secondary | ICD-10-CM

## 2019-07-24 LAB — URINALYSIS, ROUTINE W REFLEX MICROSCOPIC
Bilirubin, UA: NEGATIVE
Glucose, UA: NEGATIVE
Ketones, UA: NEGATIVE
Leukocytes,UA: NEGATIVE
Nitrite, UA: NEGATIVE
Protein,UA: NEGATIVE
RBC, UA: NEGATIVE
Specific Gravity, UA: 1.02 (ref 1.005–1.030)
Urobilinogen, Ur: 0.2 mg/dL (ref 0.2–1.0)
pH, UA: 5.5 (ref 5.0–7.5)

## 2019-07-24 LAB — MICROALBUMIN, URINE WAIVED
Creatinine, Urine Waived: 200 mg/dL (ref 10–300)
Microalb, Ur Waived: 10 mg/L (ref 0–19)
Microalb/Creat Ratio: <30 mg/g (ref <30)

## 2019-07-24 MED ORDER — LISINOPRIL 2.5 MG PO TABS: 2.5000 mg | ORAL_TABLET | Freq: Every day | ORAL | 1 refills | Status: DC

## 2019-07-24 MED ORDER — TESTOSTERONE 50 MG/5GM (1%) TD GEL: 5.0000 g | Freq: Every day | TRANSDERMAL | 5 refills | Status: DC

## 2019-07-24 MED ORDER — AMLODIPINE BESYLATE 5 MG PO TABS: 5.0000 mg | ORAL_TABLET | Freq: Every day | ORAL | 1 refills | Status: DC

## 2019-07-24 MED ORDER — SERTRALINE HCL 100 MG PO TABS: 100.0000 mg | ORAL_TABLET | Freq: Every day | ORAL | 1 refills | Status: DC

## 2019-07-24 MED ORDER — DICLOFENAC SODIUM 1 % EX GEL: 4.0000 g | Freq: Four times a day (QID) | CUTANEOUS | 12 refills | Status: DC

## 2019-07-24 NOTE — Assessment & Plan Note (Signed)
Rechecking labs today. Await results. Call with any concerns.  

## 2019-07-24 NOTE — Patient Instructions (Signed)
Health Maintenance, Male Adopting a healthy lifestyle and getting preventive care are important in promoting health and wellness. Ask your health care provider about:  The right schedule for you to have regular tests and exams.  Things you can do on your own to prevent diseases and keep yourself healthy. What should I know about diet, weight, and exercise? Eat a healthy diet   Eat a diet that includes plenty of vegetables, fruits, low-fat dairy products, and lean protein.  Do not eat a lot of foods that are high in solid fats, added sugars, or sodium. Maintain a healthy weight Body mass index (BMI) is a measurement that can be used to identify possible weight problems. It estimates body fat based on height and weight. Your health care provider can help determine your BMI and help you achieve or maintain a healthy weight. Get regular exercise Get regular exercise. This is one of the most important things you can do for your health. Most adults should:  Exercise for at least 150 minutes each week. The exercise should increase your heart rate and make you sweat (moderate-intensity exercise).  Do strengthening exercises at least twice a week. This is in addition to the moderate-intensity exercise.  Spend less time sitting. Even light physical activity can be beneficial. Watch cholesterol and blood lipids Have your blood tested for lipids and cholesterol at 53 years of age, then have this test every 5 years. You may need to have your cholesterol levels checked more often if:  Your lipid or cholesterol levels are high.  You are older than 53 years of age.  You are at high risk for heart disease. What should I know about cancer screening? Many types of cancers can be detected early and may often be prevented. Depending on your health history and family history, you may need to have cancer screening at various ages. This may include screening for:  Colorectal cancer.  Prostate  cancer.  Skin cancer.  Lung cancer. What should I know about heart disease, diabetes, and high blood pressure? Blood pressure and heart disease  High blood pressure causes heart disease and increases the risk of stroke. This is more likely to develop in people who have high blood pressure readings, are of African descent, or are overweight.  Talk with your health care provider about your target blood pressure readings.  Have your blood pressure checked: ? Every 3-5 years if you are 18-39 years of age. ? Every year if you are 40 years old or older.  If you are between the ages of 65 and 75 and are a current or former smoker, ask your health care provider if you should have a one-time screening for abdominal aortic aneurysm (AAA). Diabetes Have regular diabetes screenings. This checks your fasting blood sugar level. Have the screening done:  Once every three years after age 45 if you are at a normal weight and have a low risk for diabetes.  More often and at a younger age if you are overweight or have a high risk for diabetes. What should I know about preventing infection? Hepatitis B If you have a higher risk for hepatitis B, you should be screened for this virus. Talk with your health care provider to find out if you are at risk for hepatitis B infection. Hepatitis C Blood testing is recommended for:  Everyone born from 1945 through 1965.  Anyone with known risk factors for hepatitis C. Sexually transmitted infections (STIs)  You should be screened each year   for STIs, including gonorrhea and chlamydia, if: ? You are sexually active and are younger than 53 years of age. ? You are older than 53 years of age and your health care provider tells you that you are at risk for this type of infection. ? Your sexual activity has changed since you were last screened, and you are at increased risk for chlamydia or gonorrhea. Ask your health care provider if you are at risk.  Ask your  health care provider about whether you are at high risk for HIV. Your health care provider may recommend a prescription medicine to help prevent HIV infection. If you choose to take medicine to prevent HIV, you should first get tested for HIV. You should then be tested every 3 months for as long as you are taking the medicine. Follow these instructions at home: Lifestyle  Do not use any products that contain nicotine or tobacco, such as cigarettes, e-cigarettes, and chewing tobacco. If you need help quitting, ask your health care provider.  Do not use street drugs.  Do not share needles.  Ask your health care provider for help if you need support or information about quitting drugs. Alcohol use  Do not drink alcohol if your health care provider tells you not to drink.  If you drink alcohol: ? Limit how much you have to 0-2 drinks a day. ? Be aware of how much alcohol is in your drink. In the [place] one drink equals one 12 oz bottle of beer (355 mL), one 5 oz glass of wine (148 mL), or one 1 oz glass of hard liquor (44 mL). General instructions  Schedule regular health, dental, and eye exams.  Stay current with your vaccines.  Tell your health care provider if: ? You often feel depressed. ? You have ever been abused or do not feel safe at home. Summary  Adopting a healthy lifestyle and getting preventive care are important in promoting health and wellness.  Follow your health care provider's instructions about healthy diet, exercising, and getting tested or screened for diseases.  Follow your health care provider's instructions on monitoring your cholesterol and blood pressure. This information is not intended to replace advice given to you by your health care provider. Make sure you discuss any questions you have with your health care provider. Document Revised: 01/30/2018 Document Reviewed: 01/30/2018 Elsevier Patient Education  [facility] [facility]  Rehab Ask your health care provider which exercises are safe for you. Do exercises exactly as told by your health care provider and adjust them as directed. It is normal to feel mild stretching, pulling, tightness, or discomfort as you do these exercises. Stop right away if you feel sudden pain or your pain gets significantly worse. Do not begin these exercises until told by your health care provider. Stretching and range-of-motion exercises These exercises warm up your muscles and joints and improve the movement and flexibility of your hip and pelvis. Quadriceps stretch, prone  1. Lie on your abdomen on a firm surface, such as a bed or padded floor (prone position). 2. Bend your left / right knee and reach back to hold your ankle or pant leg. If you cannot reach your ankle or pant leg, loop a belt around your foot and grab the belt instead. 3. Gently pull your heel toward your buttocks. Your knee should not slide out to the side. You should feel a stretch in the front of your thigh and knee (quadriceps). 4. Hold this  position for __________ seconds. Repeat __________ times. Complete this exercise __________ times a day. Iliotibial band stretch An iliotibial band is a strong band of muscle tissue that runs from the outer side of your hip to the outer side of your thigh and knee. 1. Lie on your side with your left / right leg in the top position. 2. Bend both of your knees and grab your left / right ankle. Stretch out your bottom arm to help you balance. 3. Slowly bring your top knee back so your thigh goes behind your trunk. 4. Slowly lower your top leg toward the floor until you feel a gentle stretch on the outside of your left / right hip and thigh. If you do not feel a stretch and your knee will not fall farther, place the heel of your other foot on top of your knee and pull your knee down toward the floor with your foot. 5. Hold this position for __________ seconds. Repeat __________ times.  Complete this exercise __________ times a day. Strengthening exercises These exercises build strength and endurance in your hip and pelvis. Endurance is the ability to use your muscles for a long time, even after they get tired. Straight leg raises, side-lying This exercise strengthens the muscles that rotate the leg at the hip and move it away from your body (hip abductors). 1. Lie on your side with your left / right leg in the top position. Lie so your head, shoulder, hip, and knee line up. You may bend your bottom knee to help you balance. 2. Roll your hips slightly forward so your hips are stacked directly over each other and your left / right knee is facing forward. 3. Tense the muscles in your outer thigh and lift your top leg 4-6 inches (10-15 cm). 4. Hold this position for __________ seconds. 5. Slowly return to the starting position. Let your muscles relax completely before doing another repetition. Repeat __________ times. Complete this exercise __________ times a day. Leg raises, prone This exercise strengthens the muscles that move the hips (hip extensors). 1. Lie on your abdomen on your bed or a firm surface. You can put a pillow under your hips if that is more comfortable for your lower back. 2. Bend your left / right knee so your foot is straight up in the air. 3. Squeeze your buttocks muscles and lift your left / right thigh off the bed. Do not let your back arch. 4. Tense your thigh muscle as hard as you can without increasing any knee pain. 5. Hold this position for __________ seconds. 6. Slowly lower your leg to the starting position and allow it to relax completely. Repeat __________ times. Complete this exercise __________ times a day. Hip hike 1. Stand sideways on a bottom step. Stand on your left / right leg with your other foot unsupported next to the step. You can hold on to the railing or wall for balance if needed. 2. Keep your knees straight and your torso square.  Then lift your left / right hip up toward the ceiling. 3. Slowly let your left / right hip lower toward the floor, past the starting position. Your foot should get closer to the floor. Do not lean or bend your knees. Repeat __________ times. Complete this exercise __________ times a day. This information is not intended to replace advice given to you by your health care provider. Make sure you discuss any questions you have with your health care provider. Document Revised: 05/30/2018  Document Reviewed: 11/28/2017 Elsevier Patient Education  [employer].

## 2019-07-24 NOTE — Assessment & Plan Note (Signed)
Under good control on current regimen. Continue current regimen. Continue to monitor. Call with any concerns. Refills given. Labs drawn today.   

## 2019-07-24 NOTE — Assessment & Plan Note (Signed)
Rechecking labs today. Await results. Treat as needed.  °

## 2019-07-24 NOTE — Assessment & Plan Note (Signed)
Under good control on current regimen. Continue current regimen. Continue to monitor. Call with any concerns. Refills given.   

## 2019-07-24 NOTE — Assessment & Plan Note (Signed)
Doing well on his testosterone. Rechecking levels today. Refills given. Call with any concerns.

## 2019-07-24 NOTE — Assessment & Plan Note (Signed)
Encouraged diet and exercise with the goal of losing 1-2lb per week. Continue to monitor.

## 2019-07-24 NOTE — Progress Notes (Signed)
BP 139/89 (BP Location: Left Arm, Patient Position: Sitting, Cuff Size: Normal)   Pulse 72   Temp 97.6 F (36.4 C) (Oral)   Ht 5' 8.11" (1.73 m)   Wt 273 lb 3.2 oz (123.9 kg)   SpO2 97%   BMI 41.41 kg/m    Subjective:    Patient ID: Eric Blevins, male    DOB: [birth year], 54 y.o.   MRN: [id number]  HPI: THEORY IMIG is a 54 y.o. male presenting on 07/24/2019 for comprehensive medical examination. Current medical complaints include:  Has been having R knee pain for about the past 3 months. Has been seeing the orthopedics a few times and didn't see anything going on. Hurting on the lateral side.   HYPERTENSION / HYPERLIPIDEMIA Satisfied with current treatment? yes Duration of hypertension: chronic BP monitoring frequency: not checking BP medication side effects: no Past BP meds: lisinopril, amodipine Duration of hyperlipidemia: chronic Cholesterol medication side effects: not on anything Cholesterol supplements: none Medication compliance: excellent compliance Aspirin: no Recent stressors: no Recurrent headaches: no Visual changes: no Palpitations: no Dyspnea: no Chest pain: no Lower extremity edema: no Dizzy/lightheaded: no  LOW TESTOSTERONE Duration: chronic Status: controlled  Satisfied with current treatment:  yes Previous testosterone therapies: Medication side effects:  no Medication compliance: excellent compliance Decreased libido: no Fatigue: no Depressed mood: no Muscle weakness: no Erectile dysfunction: no  DEPRESSION Mood status: controlled Satisfied with current treatment?: yes Symptom severity: mild  Duration of current treatment : chronic Side effects: no Medication compliance: excellent compliance Psychotherapy/counseling: no  Previous psychiatric medications: sertraline Depressed mood: no Anxious mood: no Anhedonia: no Significant weight loss or gain: no Insomnia: no  Fatigue: yes Feelings of worthlessness or guilt:  no Impaired concentration/indecisiveness: no Suicidal ideations: no Hopelessness: no Crying spells: no Depression screen [facility] 2/9 07/24/2019 07/24/2019 12/26/2018 05/16/2018 11/08/2017  Decreased Interest 0 0 0 0 0  Down, Depressed, Hopeless 0 0 0 0 0  PHQ - 2 Score 0 0 0 0 0  Altered sleeping 0 - 0 0 0  Tired, decreased energy 1 - 1 0 0  Change in appetite 0 - 0 0 0  Feeling bad or failure about yourself  0 - 0 0 0  Trouble concentrating 0 - 0 0 0  Moving slowly or fidgety/restless 0 - 0 0 0  Suicidal thoughts 0 - 0 0 0  PHQ-9 Score 1 - 1 0 0  Difficult doing work/chores - - Not difficult at all - Not difficult at all     He currently lives with: wife Interim Problems from his last visit: no  Depression Screen done today and results listed below:  Depression screen [facility] 2/9 07/24/2019 07/24/2019 12/26/2018 05/16/2018 11/08/2017  Decreased Interest 0 0 0 0 0  Down, Depressed, Hopeless 0 0 0 0 0  PHQ - 2 Score 0 0 0 0 0  Altered sleeping 0 - 0 0 0  Tired, decreased energy 1 - 1 0 0  Change in appetite 0 - 0 0 0  Feeling bad or failure about yourself  0 - 0 0 0  Trouble concentrating 0 - 0 0 0  Moving slowly or fidgety/restless 0 - 0 0 0  Suicidal thoughts 0 - 0 0 0  PHQ-9 Score 1 - 1 0 0  Difficult doing work/chores - - Not difficult at all - Not difficult at all    Past Medical History:  Past Medical History:  Diagnosis Date  . Allergy   .  Benign hypertensive renal disease   . Chronic kidney disease, stage I   . ED (erectile dysfunction)   . Hyperlipidemia   . Hypertension   . Hypogonadism in male   . Obesity   . Sleep apnea   . Thrombocytosis ([place])     Surgical History:  Past Surgical History:  Procedure Laterality Date  . COLONOSCOPY WITH PROPOFOL N/A 12/17/2017   Procedure: COLONOSCOPY WITH PROPOFOL;  Surgeon: Lin Landsman, MD;  Location: [facility] ENDOSCOPY;  Service: Gastroenterology;  Laterality: N/A;  . LUMBAR DISC SURGERY  1990   l4/5     Medications:   Current Outpatient Medications on File Prior to Visit  Medication Sig  . ibuprofen (ADVIL) 600 MG tablet Take 1 tablet (600 mg total) by mouth every 6 (six) hours as needed.  . loratadine (CLARITIN) 10 MG tablet Take 10 mg by mouth daily.  . ondansetron (ZOFRAN ODT) 4 MG disintegrating tablet Take 1 tablet (4 mg total) by mouth every 8 (eight) hours as needed.  Marland Kitchen oxyCODONE-acetaminophen (PERCOCET) 5-325 MG tablet Take 1 tablet by mouth every 4 (four) hours as needed. (Patient not taking: Reported on 07/24/2019)   No current facility-administered medications on file prior to visit.    Allergies:  No Known Allergies  Social History:  Social History   Socioeconomic History  . Marital status: Married    Spouse name: Not on file  . Number of children: Not on file  . Years of education: Not on file  . Highest education level: Not on file  Occupational History  . Not on file  Tobacco Use  . Smoking status: Never Smoker  . Smokeless tobacco: Never Used  Substance and Sexual Activity  . Alcohol use: No  . Drug use: No  . Sexual activity: Yes    Birth control/protection: Condom  Other Topics Concern  . Not on file  Social History Narrative  . Not on file   Social Determinants of Health   Financial Resource Strain:   . Difficulty of Paying Living Expenses:   Food Insecurity:   . Worried About Charity fundraiser in the Last Year:   . Arboriculturist in the Last Year:   Transportation Needs:   . Film/video editor (Medical):   Marland Kitchen Lack of Transportation (Non-Medical):   Physical Activity:   . Days of Exercise per Week:   . Minutes of Exercise per Session:   Stress:   . Feeling of Stress :   Social Connections:   . Frequency of Communication with Friends and Family:   . Frequency of Social Gatherings with Friends and Family:   . Attends Religious Services:   . Active Member of Clubs or Organizations:   . Attends Archivist Meetings:   Marland Kitchen Marital Status:    Intimate Partner Violence:   . Fear of Current or Ex-Partner:   . Emotionally Abused:   Marland Kitchen Physically Abused:   . Sexually Abused:    Social History   Tobacco Use  Smoking Status Never Smoker  Smokeless Tobacco Never Used   Social History   Substance and Sexual Activity  Alcohol Use No    Family History:  Family History  Problem Relation Age of Onset  . Hypertension Father   . Hyperlipidemia Father   . Diabetes Mother   . Cancer Maternal Grandmother   . Cancer Maternal Grandfather   . Diabetes Paternal Grandmother   . Diabetes Brother   . Thyroid disease Brother  Past medical history, surgical history, medications, allergies, family history and social history reviewed with patient today and changes made to appropriate areas of the chart.   Review of Systems  Constitutional: Negative.   HENT: Negative.   Eyes: Negative.   Respiratory: Negative.   Cardiovascular: Negative.   Gastrointestinal: Negative.   Genitourinary: Negative.   Musculoskeletal: Positive for joint pain. Negative for back pain, falls, myalgias and neck pain.  Skin: Negative.   Neurological: Negative.   Endo/Heme/Allergies: Positive for environmental allergies. Negative for polydipsia. Does not bruise/bleed easily.  Psychiatric/Behavioral: Negative.     All other ROS negative except what is listed above and in the HPI.      Objective:    BP 139/89 (BP Location: Left Arm, Patient Position: Sitting, Cuff Size: Normal)   Pulse 72   Temp 97.6 F (36.4 C) (Oral)   Ht 5' 8.11" (1.73 m)   Wt 273 lb 3.2 oz (123.9 kg)   SpO2 97%   BMI 41.41 kg/m   Wt Readings from Last 3 Encounters:  07/24/19 273 lb 3.2 oz (123.9 kg)  09/18/18 250 lb (113.4 kg)  05/16/18 250 lb (113.4 kg)    Physical Exam Vitals and nursing note reviewed.  Constitutional:      General: He is not in acute distress.    Appearance: Normal appearance. He is obese. He is not ill-appearing, toxic-appearing or diaphoretic.   HENT:     Head: Normocephalic and atraumatic.     Right Ear: Tympanic membrane, ear canal and external ear normal. There is no impacted cerumen.     Left Ear: Tympanic membrane, ear canal and external ear normal. There is no impacted cerumen.     Nose: Nose normal. No congestion or rhinorrhea.     Mouth/Throat:     Mouth: Mucous membranes are moist.     Pharynx: Oropharynx is clear. No oropharyngeal exudate or posterior oropharyngeal erythema.  Eyes:     General: No scleral icterus.       Right eye: No discharge.        Left eye: No discharge.     Extraocular Movements: Extraocular movements intact.     Conjunctiva/sclera: Conjunctivae normal.     Pupils: Pupils are equal, round, and reactive to light.  Neck:     Vascular: No carotid bruit.  Cardiovascular:     Rate and Rhythm: Normal rate and regular rhythm.     Pulses: Normal pulses.     Heart sounds: No murmur. No friction rub. No gallop.   Pulmonary:     Effort: Pulmonary effort is normal. No respiratory distress.     Breath sounds: Normal breath sounds. No stridor. No wheezing, rhonchi or rales.  Chest:     Chest wall: No tenderness.  Abdominal:     General: Abdomen is flat. Bowel sounds are normal. There is no distension.     Palpations: Abdomen is soft. There is no mass.     Tenderness: There is no abdominal tenderness. There is no right CVA tenderness, left CVA tenderness, guarding or rebound.     Hernia: No hernia is present.  Genitourinary:    Comments: Genital exam deferred with shared decision making Musculoskeletal:        General: No swelling, tenderness, deformity or signs of injury.     Cervical back: Normal range of motion and neck supple. No rigidity. No muscular tenderness.     Right lower leg: No edema.     Left lower leg: No  edema.  Lymphadenopathy:     Cervical: No cervical adenopathy.  Skin:    General: Skin is warm and dry.     Capillary Refill: Capillary refill takes less than 2 seconds.      Coloration: Skin is not jaundiced or pale.     Findings: No bruising, erythema, lesion or rash.  Neurological:     General: No focal deficit present.     Mental Status: He is alert and oriented to person, place, and time.     Cranial Nerves: No cranial nerve deficit.     Sensory: No sensory deficit.     Motor: No weakness.     Coordination: Coordination normal.     Gait: Gait normal.     Deep Tendon Reflexes: Reflexes normal.  Psychiatric:        Mood and Affect: Mood normal.        Behavior: Behavior normal.        Thought Content: Thought content normal.        Judgment: Judgment normal.     Results for orders placed or performed during the hospital encounter of 09/19/18  Urinalysis, Complete w Microscopic  Result Value Ref Range   Color, Urine YELLOW (A) YELLOW   APPearance CLEAR (A) CLEAR   Specific Gravity, Urine 1.014 1.005 - 1.030   pH 5.0 5.0 - 8.0   Glucose, UA NEGATIVE NEGATIVE mg/dL   Hgb urine dipstick SMALL (A) NEGATIVE   Bilirubin Urine NEGATIVE NEGATIVE   Ketones, ur NEGATIVE NEGATIVE mg/dL   Protein, ur NEGATIVE NEGATIVE mg/dL   Nitrite NEGATIVE NEGATIVE   Leukocytes,Ua NEGATIVE NEGATIVE   RBC / HPF 0-5 0 - 5 RBC/hpf   WBC, UA 0-5 0 - 5 WBC/hpf   Bacteria, UA NONE SEEN NONE SEEN   Squamous Epithelial / LPF NONE SEEN 0 - 5  CBC  Result Value Ref Range   WBC 8.7 4.0 - 10.5 K/uL   RBC 5.00 4.22 - 5.81 MIL/uL   Hemoglobin 15.3 13.0 - 17.0 g/dL   HCT 45.9 39.0 - 52.0 %   MCV 91.8 80.0 - 100.0 fL   MCH 30.6 26.0 - 34.0 pg   MCHC 33.3 30.0 - 36.0 g/dL   RDW 13.3 11.5 - 15.5 %   Platelets 385 150 - 400 K/uL   nRBC 0.0 0.0 - 0.2 %  Basic metabolic panel  Result Value Ref Range   Sodium 137 135 - 145 mmol/L   Potassium 4.2 3.5 - 5.1 mmol/L   Chloride 104 98 - 111 mmol/L   CO2 25 22 - 32 mmol/L   Glucose, Bld 112 (H) 70 - 99 mg/dL   BUN 17 6 - 20 mg/dL   Creatinine, Ser 1.47 (H) 0.61 - 1.24 mg/dL   Calcium 8.9 8.9 - 10.3 mg/dL   GFR calc non Af Amer 54  (L) >60 mL/min   GFR calc Af Amer >60 >60 mL/min   Anion gap 8 5 - 15      Assessment & Plan:   Problem List Items Addressed This Visit      Endocrine   Hypogonadism in male    Doing well on his testosterone. Rechecking levels today. Refills given. Call with any concerns.       Relevant Orders   CBC with Differential/Platelet   Comprehensive metabolic panel   PSA   Testosterone, free, total([facility])     Genitourinary   Benign hypertensive renal disease    Under good control on current regimen.  Continue current regimen. Continue to monitor. Call with any concerns. Refills given. Labs drawn today.       Relevant Orders   CBC with Differential/Platelet   Comprehensive metabolic panel   Microalbumin, Urine Waived   Urinalysis, Routine w reflex microscopic   Chronic kidney disease, stage I    Rechecking labs today. Await results. Call with any concerns.       Relevant Orders   CBC with Differential/Platelet   Comprehensive metabolic panel   Urinalysis, Routine w reflex microscopic     Other   Hyperlipidemia    Rechecking labs today. Await results. Treat as needed.       Relevant Medications   lisinopril (ZESTRIL) 2.5 MG tablet   amLODipine (NORVASC) 5 MG tablet   Other Relevant Orders   CBC with Differential/Platelet   Comprehensive metabolic panel   Lipid Panel w/o Chol/HDL Ratio   Morbid obesity ([place])    Encouraged diet and exercise with the goal of losing 1-2lb per week. Continue to monitor.       Depression, major, single episode, moderate ([place])    Under good control on current regimen. Continue current regimen. Continue to monitor. Call with any concerns. Refills given.       Relevant Medications   sertraline (ZOLOFT) 100 MG tablet   Other Relevant Orders   CBC with Differential/Platelet   Comprehensive metabolic panel   TSH    Other Visit Diagnoses    Routine general medical examination at a health care facility    -  Primary   Vaccines  up to date. Screening labs checked today. Colonoscopy up to date. Continue diet and exercise. Call with any concerns. Continue to monitor.        Discussed aspirin prophylaxis for myocardial infarction prevention and decision was made to continue ASA  LABORATORY TESTING:  Health maintenance labs ordered today as discussed above.   The natural history of prostate cancer and ongoing controversy regarding screening and potential treatment outcomes of prostate cancer has been discussed with the patient. The meaning of a false positive PSA and a false negative PSA has been discussed. He indicates understanding of the limitations of this screening test and wishes to proceed with screening PSA testing.   IMMUNIZATIONS:   - Tdap: Tetanus vaccination status reviewed: last tetanus booster within 10 years. - Influenza: Postponed to flu season - Pneumovax: Not applicable  SCREENING: - Colonoscopy: Up to date  Discussed with patient purpose of the colonoscopy is to detect colon cancer at curable precancerous or early stages   PATIENT COUNSELING:    Sexuality: Discussed sexually transmitted diseases, partner selection, use of condoms, avoidance of unintended pregnancy  and contraceptive alternatives.   Advised to avoid cigarette smoking.  I discussed with the patient that most people either abstain from alcohol or drink within safe limits (<=14/week and <=4 drinks/occasion for males, <=7/weeks and <= 3 drinks/occasion for females) and that the risk for alcohol disorders and other health effects rises proportionally with the number of drinks per week and how often a drinker exceeds daily limits.  Discussed cessation/primary prevention of drug use and availability of treatment for abuse.   Diet: Encouraged to adjust caloric intake to maintain  or achieve ideal body weight, to reduce intake of dietary saturated fat and total fat, to limit sodium intake by avoiding high sodium foods and not adding table  salt, and to maintain adequate dietary potassium and calcium preferably from fresh fruits, vegetables, and low-fat dairy products.  stressed the importance of regular exercise  Injury prevention: Discussed safety belts, safety helmets, smoke detector, smoking near bedding or upholstery.   Dental health: Discussed importance of regular tooth brushing, flossing, and dental visits.   Follow up plan: NEXT PREVENTATIVE PHYSICAL DUE IN 1 YEAR. Return in about 6 months (around 01/23/2020).

## 2019-07-25 ENCOUNTER — Encounter: Payer: Self-pay | Admitting: Family Medicine

## 2019-07-26 LAB — CBC WITH DIFFERENTIAL/PLATELET
Basophils Absolute: 0.1 10*3/uL (ref 0.0–0.2)
Basos: 1 %
EOS (ABSOLUTE): 0.3 10*3/uL (ref 0.0–0.4)
Eos: 6 %
Hematocrit: 45.7 % (ref 37.5–51.0)
Hemoglobin: 15 g/dL (ref 13.0–17.7)
Immature Grans (Abs): 0 10*3/uL (ref 0.0–0.1)
Immature Granulocytes: 0 %
Lymphocytes Absolute: 2.5 10*3/uL (ref 0.7–3.1)
Lymphs: 49 %
MCH: 30.2 pg (ref 26.6–33.0)
MCHC: 32.8 g/dL (ref 31.5–35.7)
MCV: 92 fL (ref 79–97)
Monocytes Absolute: 0.4 10*3/uL (ref 0.1–0.9)
Monocytes: 8 %
Neutrophils Absolute: 1.8 10*3/uL (ref 1.4–7.0)
Neutrophils: 36 %
Platelets: 359 10*3/uL (ref 150–450)
RBC: 4.97 x10E6/uL (ref 4.14–5.80)
RDW: 13.1 % (ref 11.6–15.4)
WBC: 5.1 10*3/uL (ref 3.4–10.8)

## 2019-07-26 LAB — COMPREHENSIVE METABOLIC PANEL
ALT: 25 IU/L (ref 0–44)
AST: 26 IU/L (ref 0–40)
Albumin/Globulin Ratio: 1.3 (ref 1.2–2.2)
Albumin: 4 g/dL (ref 3.8–4.9)
Alkaline Phosphatase: 88 IU/L (ref 48–121)
BUN/Creatinine Ratio: 14 (ref 9–20)
BUN: 14 mg/dL (ref 6–24)
Bilirubin Total: 0.3 mg/dL (ref 0.0–1.2)
CO2: 23 mmol/L (ref 20–29)
Calcium: 9 mg/dL (ref 8.7–10.2)
Chloride: 101 mmol/L (ref 96–106)
Creatinine, Ser: 1.03 mg/dL (ref 0.76–1.27)
GFR calc Af Amer: 96 mL/min/{1.73_m2} (ref >59)
GFR calc non Af Amer: 83 mL/min/{1.73_m2} (ref >59)
Globulin, Total: 3 g/dL (ref 1.5–4.5)
Glucose: 95 mg/dL (ref 65–99)
Potassium: 4.4 mmol/L (ref 3.5–5.2)
Sodium: 138 mmol/L (ref 134–144)
Total Protein: 7 g/dL (ref 6.0–8.5)

## 2019-07-26 LAB — TESTOSTERONE, FREE, TOTAL, SHBG
Sex Hormone Binding: 19.9 nmol/L (ref 19.3–76.4)
Testosterone, Free: 6.7 pg/mL — ABNORMAL LOW (ref 7.2–24.0)
Testosterone: 192 ng/dL — ABNORMAL LOW (ref 264–916)

## 2019-07-26 LAB — PSA: Prostate Specific Ag, Serum: 0.7 ng/mL (ref 0.0–4.0)

## 2019-07-26 LAB — LIPID PANEL W/O CHOL/HDL RATIO
Cholesterol, Total: 227 mg/dL — ABNORMAL HIGH (ref 100–199)
HDL: 51 mg/dL (ref >39)
LDL Chol Calc (NIH): 154 mg/dL — ABNORMAL HIGH (ref 0–99)
Triglycerides: 121 mg/dL (ref 0–149)
VLDL Cholesterol Cal: 22 mg/dL (ref 5–40)

## 2019-07-26 LAB — TSH: TSH: 2.36 u[IU]/mL (ref 0.450–4.500)

## 2019-08-04 ENCOUNTER — Telehealth: Payer: Self-pay | Admitting: Family Medicine

## 2019-08-04 NOTE — Telephone Encounter (Signed)
Please advise pt seen 07/24/19 do not see it on med list  Copied from [place] [id number]. Topic: General - Inquiry >> Aug 04, 2019  3:48 PM Eric Blevins D wrote: Reason for CRM: Pt called saying he was in a couple weeks ago for a FU and talked to Dr. Wynetta Emery about his knee pain and he thought she was going to sending Hydrocodone to the pharmacy .  He is asking if that was ever sent to the pharmacy or not.   [facility].

## 2019-08-04 NOTE — Telephone Encounter (Addendum)
I have never written that for him. He got it from orthopedics. I would advise him to follow up with orthopedics

## 2019-08-05 NOTE — Telephone Encounter (Signed)
Patient notified

## 2020-01-23 ENCOUNTER — Other Ambulatory Visit: Payer: Self-pay

## 2020-01-23 ENCOUNTER — Ambulatory Visit (INDEPENDENT_AMBULATORY_CARE_PROVIDER_SITE_OTHER): Payer: BC Managed Care – PPO | Admitting: Family Medicine

## 2020-01-23 ENCOUNTER — Encounter: Payer: Self-pay | Admitting: Family Medicine

## 2020-01-23 VITALS — BP 137/80 | HR 71 | Temp 98.0°F | Ht 69.0 in | Wt 272.4 lb

## 2020-01-23 DIAGNOSIS — E782 Mixed hyperlipidemia: Secondary | ICD-10-CM

## 2020-01-23 DIAGNOSIS — F321 Major depressive disorder, single episode, moderate: Secondary | ICD-10-CM

## 2020-01-23 DIAGNOSIS — I129 Hypertensive chronic kidney disease with stage 1 through stage 4 chronic kidney disease, or unspecified chronic kidney disease: Secondary | ICD-10-CM | POA: Diagnosis not present

## 2020-01-23 DIAGNOSIS — N181 Chronic kidney disease, stage 1: Secondary | ICD-10-CM

## 2020-01-23 DIAGNOSIS — Z23 Encounter for immunization: Secondary | ICD-10-CM

## 2020-01-23 DIAGNOSIS — H04129 Dry eye syndrome of unspecified lacrimal gland: Secondary | ICD-10-CM

## 2020-01-23 DIAGNOSIS — E291 Testicular hypofunction: Secondary | ICD-10-CM

## 2020-01-23 LAB — URINALYSIS, ROUTINE W REFLEX MICROSCOPIC
Bilirubin, UA: NEGATIVE
Glucose, UA: NEGATIVE
Ketones, UA: NEGATIVE
Leukocytes,UA: NEGATIVE
Nitrite, UA: NEGATIVE
Protein,UA: NEGATIVE
RBC, UA: NEGATIVE
Specific Gravity, UA: 1.02 (ref 1.005–1.030)
Urobilinogen, Ur: 0.2 mg/dL (ref 0.2–1.0)
pH, UA: 6 (ref 5.0–7.5)

## 2020-01-23 LAB — MICROALBUMIN, URINE WAIVED
Creatinine, Urine Waived: 100 mg/dL (ref 10–300)
Microalb, Ur Waived: 30 mg/L — ABNORMAL HIGH (ref 0–19)
Microalb/Creat Ratio: <30 mg/g (ref <30)

## 2020-01-23 MED ORDER — AMLODIPINE BESYLATE 5 MG PO TABS: 5.0000 mg | ORAL_TABLET | Freq: Every day | ORAL | 1 refills | Status: DC

## 2020-01-23 MED ORDER — SERTRALINE HCL 100 MG PO TABS
100.0000 mg | ORAL_TABLET | Freq: Every day | ORAL | 1 refills | Status: DC
Start: 2020-01-23 — End: 2020-07-29

## 2020-01-23 MED ORDER — LISINOPRIL 2.5 MG PO TABS
2.5000 mg | ORAL_TABLET | Freq: Every day | ORAL | 1 refills | Status: DC
Start: 2020-01-23 — End: 2020-07-29

## 2020-01-23 MED ORDER — TESTOSTERONE 50 MG/5GM (1%) TD GEL
5.0000 g | Freq: Every day | TRANSDERMAL | 5 refills | Status: DC
Start: 2020-01-23 — End: 2020-02-01

## 2020-01-23 NOTE — Assessment & Plan Note (Signed)
Under good control on current regimen. Continue current regimen. Continue to monitor. Call with any concerns. Refills given. Labs drawn today.   

## 2020-01-23 NOTE — Progress Notes (Signed)
BP 137/80   Pulse 71   Temp 98 F (36.7 C) (Oral)   Ht 5\' 9"  (1.753 m)   Wt 272 lb 6.4 oz (123.6 kg)   SpO2 98%   BMI 40.23 kg/m    Subjective:    Patient ID: Eric Blevins, male    DOB: [birth year], 54 y.o.   MRN: [id number]  HPI: Eric Blevins is a 54 y.o. male  Chief Complaint  Patient presents with  . Hyperlipidemia  . Depression  . Hypertension   HYPERTENSION / HYPERLIPIDEMIA Satisfied with current treatment? no Duration of hypertension: chronic BP monitoring frequency: not checking BP medication side effects: no Past BP meds: amlodipine, lisinopril Duration of hyperlipidemia: chronic Cholesterol medication side effects: not on anything Cholesterol supplements: fish oil Past cholesterol medications: none Medication compliance: excellent compliance Aspirin: no Recent stressors: no Recurrent headaches: no Visual changes: no Palpitations: no Dyspnea: no Chest pain: no Lower extremity edema: no Dizzy/lightheaded: no  DEPRESSION Mood status: controlled Satisfied with current treatment?: yes Symptom severity: mild  Duration of current treatment : chronic Side effects: no Medication compliance: excellent compliance Psychotherapy/counseling: no  Depressed mood: no Anxious mood: no Anhedonia: no Significant weight loss or gain: no Insomnia: no  Fatigue: no Feelings of worthlessness or guilt: no Impaired concentration/indecisiveness: no Suicidal ideations: no Hopelessness: no Crying spells: no Depression screen [facility] 2/9 01/23/2020 07/24/2019 07/24/2019 12/26/2018 05/16/2018  Decreased Interest 0 0 0 0 0  Down, Depressed, Hopeless 0 0 0 0 0  PHQ - 2 Score 0 0 0 0 0  Altered sleeping 0 0 - 0 0  Tired, decreased energy 0 1 - 1 0  Change in appetite 0 0 - 0 0  Feeling bad or failure about yourself  0 0 - 0 0  Trouble concentrating 0 0 - 0 0  Moving slowly or fidgety/restless 0 0 - 0 0  Suicidal thoughts 0 0 - 0 0  PHQ-9 Score 0 1 - 1 0  Difficult  doing work/chores Not difficult at all - - Not difficult at all -    LOW TESTOSTERONE Duration: chronic Status: controlled  Satisfied with current treatment:  yes Previous testosterone therapies: androgel Medication side effects:  no Medication compliance: good compliance Decreased libido: no Fatigue: no Depressed mood: no Muscle weakness: no Erectile dysfunction: no   Relevant past medical, surgical, family and social history reviewed and updated as indicated. Interim medical history since our last visit reviewed. Allergies and medications reviewed and updated.  Review of Systems  Constitutional: Negative.   Respiratory: Negative.   Cardiovascular: Negative.   Gastrointestinal: Negative.   Musculoskeletal: Negative.   Neurological: Negative.   Psychiatric/Behavioral: Negative.     Per HPI unless specifically indicated above     Objective:    BP 137/80   Pulse 71   Temp 98 F (36.7 C) (Oral)   Ht 5\' 9"  (1.753 m)   Wt 272 lb 6.4 oz (123.6 kg)   SpO2 98%   BMI 40.23 kg/m   Wt Readings from Last 3 Encounters:  01/23/20 272 lb 6.4 oz (123.6 kg)  07/24/19 273 lb 3.2 oz (123.9 kg)  09/18/18 250 lb (113.4 kg)    Physical Exam Vitals and nursing note reviewed.  Constitutional:      General: He is not in acute distress.    Appearance: Normal appearance. He is not ill-appearing, toxic-appearing or diaphoretic.  HENT:     Head: Normocephalic and atraumatic.     Right Ear:  External ear normal.     Left Ear: External ear normal.     Nose: Nose normal.     Mouth/Throat:     Mouth: Mucous membranes are moist.     Pharynx: Oropharynx is clear.  Eyes:     General: No scleral icterus.       Right eye: No discharge.        Left eye: No discharge.     Extraocular Movements: Extraocular movements intact.     Conjunctiva/sclera: Conjunctivae normal.     Pupils: Pupils are equal, round, and reactive to light.  Cardiovascular:     Rate and Rhythm: Normal rate and regular  rhythm.     Pulses: Normal pulses.     Heart sounds: Normal heart sounds. No murmur heard.  No friction rub. No gallop.   Pulmonary:     Effort: Pulmonary effort is normal. No respiratory distress.     Breath sounds: Normal breath sounds. No stridor. No wheezing, rhonchi or rales.  Chest:     Chest wall: No tenderness.  Musculoskeletal:        General: Normal range of motion.     Cervical back: Normal range of motion and neck supple.  Skin:    General: Skin is warm and dry.     Capillary Refill: Capillary refill takes less than 2 seconds.     Coloration: Skin is not jaundiced or pale.     Findings: No bruising, erythema, lesion or rash.  Neurological:     General: No focal deficit present.     Mental Status: He is alert and oriented to person, place, and time. Mental status is at baseline.  Psychiatric:        Mood and Affect: Mood normal.        Behavior: Behavior normal.        Thought Content: Thought content normal.        Judgment: Judgment normal.     Results for orders placed or performed in visit on 07/24/19  CBC with Differential/Platelet  Result Value Ref Range   WBC 5.1 3.4 - 10.8 x10E3/uL   RBC 4.97 4.14 - 5.80 x10E6/uL   Hemoglobin 15.0 13.0 - 17.7 g/dL   Hematocrit 45.7 37.5 - 51.0 %   MCV 92 79 - 97 fL   MCH 30.2 26.6 - 33.0 pg   MCHC 32.8 31 - 35 g/dL   RDW 13.1 11.6 - 15.4 %   Platelets 359 150 - 450 x10E3/uL   Neutrophils 36 Not Estab. %   Lymphs 49 Not Estab. %   Monocytes 8 Not Estab. %   Eos 6 Not Estab. %   Basos 1 Not Estab. %   Neutrophils Absolute 1.8 1.40 - 7.00 x10E3/uL   Lymphocytes Absolute 2.5 0 - 3 x10E3/uL   Monocytes Absolute 0.4 0 - 0 x10E3/uL   EOS (ABSOLUTE) 0.3 0.0 - 0.4 x10E3/uL   Basophils Absolute 0.1 0 - 0 x10E3/uL   Immature Granulocytes 0 Not Estab. %   Immature Grans (Abs) 0.0 0.0 - 0.1 x10E3/uL  Comprehensive metabolic panel  Result Value Ref Range   Glucose 95 65 - 99 mg/dL   BUN 14 6 - 24 mg/dL   Creatinine, Ser  1.03 0.76 - 1.27 mg/dL   GFR calc non Af Amer 83 >59 mL/min/1.73   GFR calc Af Amer 96 >59 mL/min/1.73   BUN/Creatinine Ratio 14 9 - 20   Sodium 138 134 - 144 mmol/L   Potassium 4.4  3.5 - 5.2 mmol/L   Chloride 101 96 - 106 mmol/L   CO2 23 20 - 29 mmol/L   Calcium 9.0 8.7 - 10.2 mg/dL   Total Protein 7.0 6.0 - 8.5 g/dL   Albumin 4.0 3.8 - 4.9 g/dL   Globulin, Total 3.0 1.5 - 4.5 g/dL   Albumin/Globulin Ratio 1.3 1.2 - 2.2   Bilirubin Total 0.3 0.0 - 1.2 mg/dL   Alkaline Phosphatase 88 48 - 121 IU/L   AST 26 0 - 40 IU/L   ALT 25 0 - 44 IU/L  Lipid Panel w/o Chol/HDL Ratio  Result Value Ref Range   Cholesterol, Total 227 (H) 100 - 199 mg/dL   Triglycerides 121 0 - 149 mg/dL   HDL 51 >39 mg/dL   VLDL Cholesterol Cal 22 5 - 40 mg/dL   LDL Chol Calc (NIH) 154 (H) 0 - 99 mg/dL  Microalbumin, Urine Waived  Result Value Ref Range   Microalb, Ur Waived 10 0 - 19 mg/L   Creatinine, Urine Waived 200 10 - 300 mg/dL   Microalb/Creat Ratio <30 <30 mg/g  PSA  Result Value Ref Range   Prostate Specific Ag, Serum 0.7 0.0 - 4.0 ng/mL  TSH  Result Value Ref Range   TSH 2.360 0.450 - 4.500 uIU/mL  Urinalysis, Routine w reflex microscopic  Result Value Ref Range   Specific Gravity, UA 1.020 1.005 - 1.030   pH, UA 5.5 5.0 - 7.5   Color, UA Yellow Yellow   Appearance Ur Clear Clear   Leukocytes,UA Negative Negative   Protein,UA Negative Negative/Trace   Glucose, UA Negative Negative   Ketones, UA Negative Negative   RBC, UA Negative Negative   Bilirubin, UA Negative Negative   Urobilinogen, Ur 0.2 0.2 - 1.0 mg/dL   Nitrite, UA Negative Negative  Testosterone, free, total([facility])  Result Value Ref Range   Testosterone 192 (L) 264 - 916 ng/dL   Testosterone, Free 6.7 (L) 7.2 - 24.0 pg/mL   Sex Hormone Binding 19.9 19.3 - 76.4 nmol/L      Assessment & Plan:   Problem List Items Addressed This Visit      Endocrine   Hypogonadism in male    Under good control on current  regimen. Continue current regimen. Continue to monitor. Call with any concerns. Refills given. Labs drawn today.       Relevant Orders   CBC with Differential/Platelet   Comprehensive metabolic panel   PSA   Testosterone, free, total([facility])     Genitourinary   Benign hypertensive renal disease    Under good control on current regimen. Continue current regimen. Continue to monitor. Call with any concerns. Refills given. Labs drawn today.        Relevant Orders   CBC with Differential/Platelet   Comprehensive metabolic panel   Microalbumin, Urine Waived   TSH   Urinalysis, Routine w reflex microscopic   Chronic kidney disease, stage I    Under good control on current regimen. Continue current regimen. Continue to monitor. Call with any concerns. Refills given. Labs drawn today.       Relevant Orders   CBC with Differential/Platelet   Comprehensive metabolic panel   Microalbumin, Urine Waived   Urinalysis, Routine w reflex microscopic     Other   Hyperlipidemia    Under good control on current regimen. Continue current regimen. Continue to monitor. Call with any concerns. Refills given. Labs drawn today.       Relevant Medications   amLODipine (  NORVASC) 5 MG tablet   lisinopril (ZESTRIL) 2.5 MG tablet   Other Relevant Orders   CBC with Differential/Platelet   Comprehensive metabolic panel   Lipid Panel w/o Chol/HDL Ratio   Depression, major, single episode, moderate (HCC) - Primary    Under good control on current regimen. Continue current regimen. Continue to monitor. Call with any concerns. Refills given. Labs drawn today.       Relevant Medications   sertraline (ZOLOFT) 100 MG tablet   Other Relevant Orders   CBC with Differential/Platelet   Comprehensive metabolic panel   TSH    Other Visit Diagnoses    Dry eye       Has been using OTC lubricants with no benefit. Will refer to opthlamology   Relevant Orders   Ambulatory referral to Ophthalmology     Need for influenza vaccination       Flu shot given today.    Relevant Orders   Flu Vaccine QUAD 6+ mos PF IM (Fluarix Quad PF)       Follow up plan: Return in about 6 months (around 07/23/2020) for physical.

## 2020-01-24 LAB — CBC WITH DIFFERENTIAL/PLATELET
Basophils Absolute: 0.1 10*3/uL (ref 0.0–0.2)
Basos: 1 %
EOS (ABSOLUTE): 0.3 10*3/uL (ref 0.0–0.4)
Eos: 4 %
Hematocrit: 45.2 % (ref 37.5–51.0)
Hemoglobin: 15.6 g/dL (ref 13.0–17.7)
Immature Grans (Abs): 0 10*3/uL (ref 0.0–0.1)
Immature Granulocytes: 0 %
Lymphocytes Absolute: 3 10*3/uL (ref 0.7–3.1)
Lymphs: 46 %
MCH: 31.2 pg (ref 26.6–33.0)
MCHC: 34.5 g/dL (ref 31.5–35.7)
MCV: 90 fL (ref 79–97)
Monocytes Absolute: 0.5 10*3/uL (ref 0.1–0.9)
Monocytes: 7 %
Neutrophils Absolute: 2.8 10*3/uL (ref 1.4–7.0)
Neutrophils: 42 %
Platelets: 437 10*3/uL (ref 150–450)
RBC: 5 x10E6/uL (ref 4.14–5.80)
RDW: 12.8 % (ref 11.6–15.4)
WBC: 6.6 10*3/uL (ref 3.4–10.8)

## 2020-01-24 LAB — COMPREHENSIVE METABOLIC PANEL
ALT: 23 IU/L (ref 0–44)
AST: 21 IU/L (ref 0–40)
Albumin/Globulin Ratio: 1.4 (ref 1.2–2.2)
Albumin: 4.3 g/dL (ref 3.8–4.9)
Alkaline Phosphatase: 92 IU/L (ref 44–121)
BUN/Creatinine Ratio: 18 (ref 9–20)
BUN: 18 mg/dL (ref 6–24)
Bilirubin Total: 0.3 mg/dL (ref 0.0–1.2)
CO2: 24 mmol/L (ref 20–29)
Calcium: 9.5 mg/dL (ref 8.7–10.2)
Chloride: 100 mmol/L (ref 96–106)
Creatinine, Ser: 1.01 mg/dL (ref 0.76–1.27)
GFR calc Af Amer: 98 mL/min/{1.73_m2} (ref >59)
GFR calc non Af Amer: 85 mL/min/{1.73_m2} (ref >59)
Globulin, Total: 3.1 g/dL (ref 1.5–4.5)
Glucose: 98 mg/dL (ref 65–99)
Potassium: 4.6 mmol/L (ref 3.5–5.2)
Sodium: 137 mmol/L (ref 134–144)
Total Protein: 7.4 g/dL (ref 6.0–8.5)

## 2020-01-24 LAB — LIPID PANEL W/O CHOL/HDL RATIO
Cholesterol, Total: 254 mg/dL — ABNORMAL HIGH (ref 100–199)
HDL: 53 mg/dL (ref >39)
LDL Chol Calc (NIH): 183 mg/dL — ABNORMAL HIGH (ref 0–99)
Triglycerides: 103 mg/dL (ref 0–149)
VLDL Cholesterol Cal: 18 mg/dL (ref 5–40)

## 2020-01-24 LAB — TESTOSTERONE, FREE, TOTAL, SHBG
Sex Hormone Binding: 22.5 nmol/L (ref 19.3–76.4)
Testosterone, Free: 5.4 pg/mL — ABNORMAL LOW (ref 7.2–24.0)
Testosterone: 150 ng/dL — ABNORMAL LOW (ref 264–916)

## 2020-01-24 LAB — PSA: Prostate Specific Ag, Serum: 0.6 ng/mL (ref 0.0–4.0)

## 2020-01-24 LAB — TSH: TSH: 2.14 u[IU]/mL (ref 0.450–4.500)

## 2020-02-01 ENCOUNTER — Encounter: Payer: Self-pay | Admitting: Family Medicine

## 2020-02-01 MED ORDER — TESTOSTERONE 50 MG/5GM (1%) TD GEL: 10.0000 g | Freq: Every day | TRANSDERMAL | 5 refills | Status: DC

## 2020-03-04 ENCOUNTER — Ambulatory Visit (INDEPENDENT_AMBULATORY_CARE_PROVIDER_SITE_OTHER): Payer: BC Managed Care – PPO | Admitting: Family Medicine

## 2020-03-04 ENCOUNTER — Other Ambulatory Visit: Payer: Self-pay

## 2020-03-04 ENCOUNTER — Encounter: Payer: Self-pay | Admitting: Family Medicine

## 2020-03-04 DIAGNOSIS — U071 COVID-19: Secondary | ICD-10-CM

## 2020-03-04 MED ORDER — HYDROCOD POLST-CPM POLST ER 10-8 MG/5ML PO SUER: 5.0000 mL | Freq: Every evening | ORAL | 0 refills | Status: DC | PRN

## 2020-03-04 MED ORDER — BENZONATATE 200 MG PO CAPS: 200.0000 mg | ORAL_CAPSULE | Freq: Two times a day (BID) | ORAL | 0 refills | Status: DC | PRN

## 2020-03-04 MED ORDER — PREDNISONE 50 MG PO TABS: 50.0000 mg | ORAL_TABLET | Freq: Every day | ORAL | 0 refills | Status: DC

## 2020-03-04 NOTE — Progress Notes (Signed)
There were no vitals taken for this visit.   Subjective:    Patient ID: Eric Blevins, male    DOB: 21-Jan-1966, 55 y.o.   MRN: [id number]  HPI: Eric Blevins is a 55 y.o. male  Chief Complaint  Patient presents with  . URI   UPPER RESPIRATORY TRACT INFECTION- had a positive test about a week ago Duration: 8 days  Worst symptom: congestion  Fever: no Cough: yes Shortness of breath: yes Wheezing: yes Chest pain: yes Chest tightness: no Chest congestion: no Nasal congestion: yes Runny nose: yes Post nasal drip: yes Sneezing: no Sore throat: yes Swollen glands: no Sinus pressure: no Headache: no Face pain: no Toothache: no Ear pain: no  Ear pressure: yes, L ear  Eyes red/itching:no Eye drainage/crusting: no  Vomiting: no Rash: no Fatigue: yes Sick contacts: no Strep contacts: no  Context: better Recurrent sinusitis: no Relief with OTC cold/cough medications: no  Treatments attempted: vitamin C, D, zinc   Relevant past medical, surgical, family and social history reviewed and updated as indicated. Interim medical history since our last visit reviewed. Allergies and medications reviewed and updated.  Review of Systems  Constitutional: Positive for fatigue. Negative for activity change, appetite change, chills, diaphoresis, fever and unexpected weight change.  HENT: Positive for congestion, postnasal drip and rhinorrhea. Negative for dental problem, drooling, ear discharge, ear pain, facial swelling, hearing loss, mouth sores, nosebleeds, sinus pressure, sinus pain, sneezing, sore throat, tinnitus, trouble swallowing and voice change.   Eyes: Negative.   Respiratory: Positive for cough, shortness of breath and wheezing. Negative for apnea, choking, chest tightness and stridor.   Cardiovascular: Negative.   Gastrointestinal: Negative.   Psychiatric/Behavioral: Negative.     Per HPI unless specifically indicated above     Objective:    There were no  vitals taken for this visit.  Wt Readings from Last 3 Encounters:  01/23/20 272 lb 6.4 oz (123.6 kg)  07/24/19 273 lb 3.2 oz (123.9 kg)  09/18/18 250 lb (113.4 kg)    Physical Exam Vitals and nursing note reviewed.  Constitutional:      General: He is not in acute distress.    Appearance: Normal appearance. He is not ill-appearing, toxic-appearing or diaphoretic.  HENT:     Head: Normocephalic and atraumatic.     Right Ear: External ear normal.     Left Ear: External ear normal.     Nose: Nose normal.     Mouth/Throat:     Mouth: Mucous membranes are moist.     Pharynx: Oropharynx is clear.  Eyes:     General: No scleral icterus.       Right eye: No discharge.        Left eye: No discharge.     Conjunctiva/sclera: Conjunctivae normal.     Pupils: Pupils are equal, round, and reactive to light.  Pulmonary:     Effort: Pulmonary effort is normal. No respiratory distress.     Comments: Speaking in full sentences Musculoskeletal:        General: Normal range of motion.     Cervical back: Normal range of motion.  Skin:    Coloration: Skin is not jaundiced or pale.     Findings: No bruising, erythema, lesion or rash.  Neurological:     Mental Status: He is alert and oriented to person, place, and time. Mental status is at baseline.  Psychiatric:        Mood and Affect: Mood normal.  Behavior: Behavior normal.        Thought Content: Thought content normal.        Judgment: Judgment normal.     Results for orders placed or performed in visit on 01/23/20  CBC with Differential/Platelet  Result Value Ref Range   WBC 6.6 3.4 - 10.8 x10E3/uL   RBC 5.00 4.14 - 5.80 x10E6/uL   Hemoglobin 15.6 13.0 - 17.7 g/dL   Hematocrit 45.2 37.5 - 51.0 %   MCV 90 79 - 97 fL   MCH 31.2 26.6 - 33.0 pg   MCHC 34.5 31.5 - 35.7 g/dL   RDW 12.8 11.6 - 15.4 %   Platelets 437 150 - 450 x10E3/uL   Neutrophils 42 Not Estab. %   Lymphs 46 Not Estab. %   Monocytes 7 Not Estab. %   Eos 4 Not  Estab. %   Basos 1 Not Estab. %   Neutrophils Absolute 2.8 1.4 - 7.0 x10E3/uL   Lymphocytes Absolute 3.0 0.7 - 3.1 x10E3/uL   Monocytes Absolute 0.5 0.1 - 0.9 x10E3/uL   EOS (ABSOLUTE) 0.3 0.0 - 0.4 x10E3/uL   Basophils Absolute 0.1 0.0 - 0.2 x10E3/uL   Immature Granulocytes 0 Not Estab. %   Immature Grans (Abs) 0.0 0.0 - 0.1 x10E3/uL  Comprehensive metabolic panel  Result Value Ref Range   Glucose 98 65 - 99 mg/dL   BUN 18 6 - 24 mg/dL   Creatinine, Ser 1.01 0.76 - 1.27 mg/dL   GFR calc non Af Amer 85 >59 mL/min/1.73   GFR calc Af Amer 98 >59 mL/min/1.73   BUN/Creatinine Ratio 18 9 - 20   Sodium 137 134 - 144 mmol/L   Potassium 4.6 3.5 - 5.2 mmol/L   Chloride 100 96 - 106 mmol/L   CO2 24 20 - 29 mmol/L   Calcium 9.5 8.7 - 10.2 mg/dL   Total Protein 7.4 6.0 - 8.5 g/dL   Albumin 4.3 3.8 - 4.9 g/dL   Globulin, Total 3.1 1.5 - 4.5 g/dL   Albumin/Globulin Ratio 1.4 1.2 - 2.2   Bilirubin Total 0.3 0.0 - 1.2 mg/dL   Alkaline Phosphatase 92 44 - 121 IU/L   AST 21 0 - 40 IU/L   ALT 23 0 - 44 IU/L  Lipid Panel w/o Chol/HDL Ratio  Result Value Ref Range   Cholesterol, Total 254 (H) 100 - 199 mg/dL   Triglycerides 103 0 - 149 mg/dL   HDL 53 >39 mg/dL   VLDL Cholesterol Cal 18 5 - 40 mg/dL   LDL Chol Calc (NIH) 183 (H) 0 - 99 mg/dL  Microalbumin, Urine Waived  Result Value Ref Range   Microalb, Ur Waived 30 (H) 0 - 19 mg/L   Creatinine, Urine Waived 100 10 - 300 mg/dL   Microalb/Creat Ratio <30 <30 mg/g  PSA  Result Value Ref Range   Prostate Specific Ag, Serum 0.6 0.0 - 4.0 ng/mL  TSH  Result Value Ref Range   TSH 2.140 0.450 - 4.500 uIU/mL  Urinalysis, Routine w reflex microscopic  Result Value Ref Range   Specific Gravity, UA 1.020 1.005 - 1.030   pH, UA 6.0 5.0 - 7.5   Color, UA Yellow Yellow   Appearance Ur Clear Clear   Leukocytes,UA Negative Negative   Protein,UA Negative Negative/Trace   Glucose, UA Negative Negative   Ketones, UA Negative Negative   RBC, UA  Negative Negative   Bilirubin, UA Negative Negative   Urobilinogen, Ur 0.2 0.2 - 1.0 mg/dL  Nitrite, UA Negative Negative  Testosterone, free, total([facility])  Result Value Ref Range   Testosterone 150 (L) 264 - 916 ng/dL   Testosterone, Free 5.4 (L) 7.2 - 24.0 pg/mL   Sex Hormone Binding 22.5 19.3 - 76.4 nmol/L      Assessment & Plan:   Problem List Items Addressed This Visit   None   Visit Diagnoses    COVID-19    -  Primary   Self quarantine until 10 days after onset of symptoms. Symptomatic treatment with prednisone, tussionex and tessalon perles. Call with any concerns.        Follow up plan: Return if symptoms worsen or fail to improve.   . This visit was completed via MyChart due to the restrictions of the COVID-19 pandemic. All issues as above were discussed and addressed. Physical exam was done as above through visual confirmation on MyChart. If it was felt that the patient should be evaluated in the office, they were directed there. The patient verbally consented to this visit. . Location of the patient: home . Location of the provider: home . Those involved with this call:  . Provider: Park Liter, DO . CMA: Yvonna Alanis, [place] . Front Desk/Registration: Jill Side  . Time spent on call: 15 minutes with patient face to face via video conference. More than 50% of this time was spent in counseling and coordination of care. 23 minutes total spent in review of patient's record and preparation of their chart.

## 2020-07-29 ENCOUNTER — Ambulatory Visit: Payer: No Typology Code available for payment source | Admitting: Family Medicine

## 2020-07-29 ENCOUNTER — Other Ambulatory Visit: Payer: Self-pay

## 2020-07-29 ENCOUNTER — Encounter: Payer: Self-pay | Admitting: Family Medicine

## 2020-07-29 VITALS — BP 132/84 | HR 68 | Temp 97.9°F | Ht 69.0 in | Wt 270.2 lb

## 2020-07-29 DIAGNOSIS — Z0000 Encounter for general adult medical examination without abnormal findings: Secondary | ICD-10-CM

## 2020-07-29 DIAGNOSIS — N181 Chronic kidney disease, stage 1: Secondary | ICD-10-CM

## 2020-07-29 DIAGNOSIS — F321 Major depressive disorder, single episode, moderate: Secondary | ICD-10-CM

## 2020-07-29 DIAGNOSIS — I129 Hypertensive chronic kidney disease with stage 1 through stage 4 chronic kidney disease, or unspecified chronic kidney disease: Secondary | ICD-10-CM

## 2020-07-29 DIAGNOSIS — E291 Testicular hypofunction: Secondary | ICD-10-CM | POA: Diagnosis not present

## 2020-07-29 DIAGNOSIS — E782 Mixed hyperlipidemia: Secondary | ICD-10-CM

## 2020-07-29 LAB — URINALYSIS, ROUTINE W REFLEX MICROSCOPIC
Bilirubin, UA: NEGATIVE
Glucose, UA: NEGATIVE
Ketones, UA: NEGATIVE
Leukocytes,UA: NEGATIVE
Nitrite, UA: NEGATIVE
Protein,UA: NEGATIVE
Specific Gravity, UA: 1.02 (ref 1.005–1.030)
Urobilinogen, Ur: 0.2 mg/dL (ref 0.2–1.0)
pH, UA: 6 (ref 5.0–7.5)

## 2020-07-29 LAB — MICROSCOPIC EXAMINATION
Bacteria, UA: NONE SEEN
Epithelial Cells (non renal): NONE SEEN /hpf (ref 0–10)
RBC, Urine: NONE SEEN /hpf (ref 0–2)
WBC, UA: NONE SEEN /hpf (ref 0–5)

## 2020-07-29 LAB — MICROALBUMIN, URINE WAIVED
Creatinine, Urine Waived: 200 mg/dL (ref 10–300)
Microalb, Ur Waived: 30 mg/L — ABNORMAL HIGH (ref 0–19)
Microalb/Creat Ratio: <30 mg/g (ref <30)

## 2020-07-29 MED ORDER — AMLODIPINE BESYLATE 5 MG PO TABS: 5.0000 mg | ORAL_TABLET | Freq: Every day | ORAL | 1 refills | Status: DC

## 2020-07-29 MED ORDER — LISINOPRIL 2.5 MG PO TABS: 2.5000 mg | ORAL_TABLET | Freq: Every day | ORAL | 1 refills | Status: DC

## 2020-07-29 MED ORDER — SERTRALINE HCL 100 MG PO TABS: 100.0000 mg | ORAL_TABLET | Freq: Every day | ORAL | 1 refills | Status: DC

## 2020-07-29 NOTE — Assessment & Plan Note (Signed)
Under good control on current regimen. Continue current regimen. Continue to monitor. Call with any concerns. Refills given. Labs drawn today.   

## 2020-07-29 NOTE — Assessment & Plan Note (Signed)
Rechecking labs today. Await results. Treat as needed.  °

## 2020-07-29 NOTE — Progress Notes (Signed)
BP 132/84   Pulse 68   Temp 97.9 F (36.6 C)   Ht 5\' 9"  (1.753 m)   Wt 270 lb 3.2 oz (122.6 kg)   SpO2 96%   BMI 39.90 kg/m    Subjective:    Patient ID: Eric Blevins, male    DOB: 1965-03-21, 55 y.o.   MRN: [id number]  HPI: Eric Blevins is a 55 y.o. male presenting on 07/29/2020 for comprehensive medical examination. Current medical complaints include:  HYPERTENSION Hypertension status: controlled  Satisfied with current treatment? yes Duration of hypertension: chronic BP monitoring frequency:  not checking BP range:  BP medication side effects:  no Medication compliance: excellent compliance Previous BP meds: Aspirin: no Recurrent headaches: no Visual changes: no Palpitations: no Dyspnea: no Chest pain: no Lower extremity edema: no Dizzy/lightheaded: no  LOW TESTOSTERONE Duration: chronic Status: controlled  Satisfied with current treatment:  yes Previous testosterone therapies: Medication side effects:  no Medication compliance: poor compliance Decreased libido: no Fatigue: no Depressed mood: no Muscle weakness: no Erectile dysfunction: no  DEPRESSION Mood status: controlled Satisfied with current treatment?: yes Symptom severity: mild  Duration of current treatment : chronic Side effects: no Medication compliance: excellent compliance Psychotherapy/counseling: no  Previous psychiatric medications: sertraline Depressed mood: no Anxious mood: no Anhedonia: no Significant weight loss or gain: no Insomnia: no  Fatigue: no Feelings of worthlessness or guilt: no Impaired concentration/indecisiveness: no Suicidal ideations: no Hopelessness: no Crying spells: no Depression screen [facility] 2/9 07/29/2020 01/23/2020 07/24/2019 07/24/2019 12/26/2018  Decreased Interest 0 0 0 0 0  Down, Depressed, Hopeless 0 0 0 0 0  PHQ - 2 Score 0 0 0 0 0  Altered sleeping 0 0 0 - 0  Tired, decreased energy 1 0 1 - 1  Change in appetite 0 0 0 - 0  Feeling bad or  failure about yourself  0 0 0 - 0  Trouble concentrating 0 0 0 - 0  Moving slowly or fidgety/restless 0 0 0 - 0  Suicidal thoughts 0 0 0 - 0  PHQ-9 Score 1 0 1 - 1  Difficult doing work/chores Not difficult at all Not difficult at all - - Not difficult at all    He currently lives with: wife and son Interim Problems from his last visit: no  Depression Screen done today and results listed below:  Depression screen [facility] 2/9 07/29/2020 01/23/2020 07/24/2019 07/24/2019 12/26/2018  Decreased Interest 0 0 0 0 0  Down, Depressed, Hopeless 0 0 0 0 0  PHQ - 2 Score 0 0 0 0 0  Altered sleeping 0 0 0 - 0  Tired, decreased energy 1 0 1 - 1  Change in appetite 0 0 0 - 0  Feeling bad or failure about yourself  0 0 0 - 0  Trouble concentrating 0 0 0 - 0  Moving slowly or fidgety/restless 0 0 0 - 0  Suicidal thoughts 0 0 0 - 0  PHQ-9 Score 1 0 1 - 1  Difficult doing work/chores Not difficult at all Not difficult at all - - Not difficult at all     Past Medical History:  Past Medical History:  Diagnosis Date   Allergy    Benign hypertensive renal disease    Chronic kidney disease, stage I    ED (erectile dysfunction)    Hyperlipidemia    Hypertension    Hypogonadism in male    Obesity    Sleep apnea    Thrombocytosis  Surgical History:  Past Surgical History:  Procedure Laterality Date   COLONOSCOPY WITH PROPOFOL N/A 12/17/2017   Procedure: COLONOSCOPY WITH PROPOFOL;  Surgeon: Lin Landsman, MD;  Location: [facility] ENDOSCOPY;  Service: Gastroenterology;  Laterality: N/A;   LUMBAR DISC SURGERY  1990   l4/5     Medications:  Current Outpatient Medications on File Prior to Visit  Medication Sig   amLODipine (NORVASC) 5 MG tablet Take 1 tablet (5 mg total) by mouth daily.   diclofenac Sodium (VOLTAREN) 1 % GEL Apply 4 g topically 4 (four) times daily.   ibuprofen (ADVIL) 600 MG tablet Take 1 tablet (600 mg total) by mouth every 6 (six) hours as needed.   lisinopril (ZESTRIL) 2.5 MG  tablet Take 1 tablet (2.5 mg total) by mouth daily.   loratadine (CLARITIN) 10 MG tablet Take 10 mg by mouth daily.   ondansetron (ZOFRAN ODT) 4 MG disintegrating tablet Take 1 tablet (4 mg total) by mouth every 8 (eight) hours as needed.   sertraline (ZOLOFT) 100 MG tablet Take 1 tablet (100 mg total) by mouth daily.   testosterone (ANDROGEL) 50 MG/5GM (1%) GEL Place 10 g onto the skin daily.   No current facility-administered medications on file prior to visit.    Allergies:  No Known Allergies  Social History:  Social History   Socioeconomic History   Marital status: Married    Spouse name: Not on file   Number of children: Not on file   Years of education: Not on file   Highest education level: Not on file  Occupational History   Not on file  Tobacco Use   Smoking status: Never   Smokeless tobacco: Never  Vaping Use   Vaping Use: Never used  Substance and Sexual Activity   Alcohol use: No   Drug use: No   Sexual activity: Yes    Birth control/protection: Condom  Other Topics Concern   Not on file  Social History Narrative   Not on file   Social Determinants of Health   Financial Resource Strain: Not on file  Food Insecurity: Not on file  Transportation Needs: Not on file  Physical Activity: Not on file  Stress: Not on file  Social Connections: Not on file  Intimate Partner Violence: Not on file   Social History   Tobacco Use  Smoking Status Never  Smokeless Tobacco Never   Social History   Substance and Sexual Activity  Alcohol Use No    Family History:  Family History  Problem Relation Age of Onset   Hypertension Father    Hyperlipidemia Father    Diabetes Mother    Cancer Maternal Grandmother    Cancer Maternal Grandfather    Diabetes Paternal Grandmother    Diabetes Brother    Thyroid disease Brother     Past medical history, surgical history, medications, allergies, family history and social history reviewed with patient today and  changes made to appropriate areas of the chart.   Review of Systems  Constitutional: Negative.   HENT: Negative.    Eyes: Negative.   Respiratory: Negative.    Cardiovascular: Negative.   Gastrointestinal: Negative.   Genitourinary: Negative.   Musculoskeletal: Negative.   Skin: Negative.   Neurological: Negative.   Endo/Heme/Allergies: Negative.   Psychiatric/Behavioral: Negative.    All other ROS negative except what is listed above and in the HPI.      Objective:    BP 132/84   Pulse 68   Temp 97.9 F (  36.6 C)   Ht 5\' 9"  (1.753 m)   Wt 270 lb 3.2 oz (122.6 kg)   SpO2 96%   BMI 39.90 kg/m   Wt Readings from Last 3 Encounters:  07/29/20 270 lb 3.2 oz (122.6 kg)  01/23/20 272 lb 6.4 oz (123.6 kg)  07/24/19 273 lb 3.2 oz (123.9 kg)    Physical Exam Vitals and nursing note reviewed.  Constitutional:      General: He is not in acute distress.    Appearance: Normal appearance. He is obese. He is not ill-appearing, toxic-appearing or diaphoretic.  HENT:     Head: Normocephalic and atraumatic.     Right Ear: Tympanic membrane, ear canal and external ear normal. There is no impacted cerumen.     Left Ear: Tympanic membrane, ear canal and external ear normal. There is no impacted cerumen.     Nose: Nose normal. No congestion or rhinorrhea.     Mouth/Throat:     Mouth: Mucous membranes are moist.     Pharynx: Oropharynx is clear. No oropharyngeal exudate or posterior oropharyngeal erythema.  Eyes:     General: No scleral icterus.       Right eye: No discharge.        Left eye: No discharge.     Extraocular Movements: Extraocular movements intact.     Conjunctiva/sclera: Conjunctivae normal.     Pupils: Pupils are equal, round, and reactive to light.  Neck:     Vascular: No carotid bruit.  Cardiovascular:     Rate and Rhythm: Normal rate and regular rhythm.     Pulses: Normal pulses.     Heart sounds: No murmur heard.   No friction rub. No gallop.  Pulmonary:      Effort: Pulmonary effort is normal. No respiratory distress.     Breath sounds: Normal breath sounds. No stridor. No wheezing, rhonchi or rales.  Chest:     Chest wall: No tenderness.  Abdominal:     General: Abdomen is flat. Bowel sounds are normal. There is no distension.     Palpations: Abdomen is soft. There is no mass.     Tenderness: There is no abdominal tenderness. There is no right CVA tenderness, left CVA tenderness, guarding or rebound.     Hernia: No hernia is present.  Genitourinary:    Comments: Genital exam deferred with shared decision making Musculoskeletal:        General: No swelling, tenderness, deformity or signs of injury.     Cervical back: Normal range of motion and neck supple. No rigidity. No muscular tenderness.     Right lower leg: No edema.     Left lower leg: No edema.  Lymphadenopathy:     Cervical: No cervical adenopathy.  Skin:    General: Skin is warm and dry.     Capillary Refill: Capillary refill takes less than 2 seconds.     Coloration: Skin is not jaundiced or pale.     Findings: No bruising, erythema, lesion or rash.  Neurological:     General: No focal deficit present.     Mental Status: He is alert and oriented to person, place, and time.     Cranial Nerves: No cranial nerve deficit.     Sensory: No sensory deficit.     Motor: No weakness.     Coordination: Coordination normal.     Gait: Gait normal.     Deep Tendon Reflexes: Reflexes normal.  Psychiatric:  Mood and Affect: Mood normal.        Behavior: Behavior normal.        Thought Content: Thought content normal.        Judgment: Judgment normal.    Results for orders placed or performed in visit on 01/23/20  CBC with Differential/Platelet  Result Value Ref Range   WBC 6.6 3.4 - 10.8 x10E3/uL   RBC 5.00 4.14 - 5.80 x10E6/uL   Hemoglobin 15.6 13.0 - 17.7 g/dL   Hematocrit 45.2 37.5 - 51.0 %   MCV 90 79 - 97 fL   MCH 31.2 26.6 - 33.0 pg   MCHC 34.5 31.5 - 35.7 g/dL    RDW 12.8 11.6 - 15.4 %   Platelets 437 150 - 450 x10E3/uL   Neutrophils 42 Not Estab. %   Lymphs 46 Not Estab. %   Monocytes 7 Not Estab. %   Eos 4 Not Estab. %   Basos 1 Not Estab. %   Neutrophils Absolute 2.8 1.4 - 7.0 x10E3/uL   Lymphocytes Absolute 3.0 0.7 - 3.1 x10E3/uL   Monocytes Absolute 0.5 0.1 - 0.9 x10E3/uL   EOS (ABSOLUTE) 0.3 0.0 - 0.4 x10E3/uL   Basophils Absolute 0.1 0.0 - 0.2 x10E3/uL   Immature Granulocytes 0 Not Estab. %   Immature Grans (Abs) 0.0 0.0 - 0.1 x10E3/uL  Comprehensive metabolic panel  Result Value Ref Range   Glucose 98 65 - 99 mg/dL   BUN 18 6 - 24 mg/dL   Creatinine, Ser 1.01 0.76 - 1.27 mg/dL   GFR calc non Af Amer 85 >59 mL/min/1.73   GFR calc Af Amer 98 >59 mL/min/1.73   BUN/Creatinine Ratio 18 9 - 20   Sodium 137 134 - 144 mmol/L   Potassium 4.6 3.5 - 5.2 mmol/L   Chloride 100 96 - 106 mmol/L   CO2 24 20 - 29 mmol/L   Calcium 9.5 8.7 - 10.2 mg/dL   Total Protein 7.4 6.0 - 8.5 g/dL   Albumin 4.3 3.8 - 4.9 g/dL   Globulin, Total 3.1 1.5 - 4.5 g/dL   Albumin/Globulin Ratio 1.4 1.2 - 2.2   Bilirubin Total 0.3 0.0 - 1.2 mg/dL   Alkaline Phosphatase 92 44 - 121 IU/L   AST 21 0 - 40 IU/L   ALT 23 0 - 44 IU/L  Lipid Panel w/o Chol/HDL Ratio  Result Value Ref Range   Cholesterol, Total 254 (H) 100 - 199 mg/dL   Triglycerides 103 0 - 149 mg/dL   HDL 53 >39 mg/dL   VLDL Cholesterol Cal 18 5 - 40 mg/dL   LDL Chol Calc (NIH) 183 (H) 0 - 99 mg/dL  Microalbumin, Urine Waived  Result Value Ref Range   Microalb, Ur Waived 30 (H) 0 - 19 mg/L   Creatinine, Urine Waived 100 10 - 300 mg/dL   Microalb/Creat Ratio <30 <30 mg/g  PSA  Result Value Ref Range   Prostate Specific Ag, Serum 0.6 0.0 - 4.0 ng/mL  TSH  Result Value Ref Range   TSH 2.140 0.450 - 4.500 uIU/mL  Urinalysis, Routine w reflex microscopic  Result Value Ref Range   Specific Gravity, UA 1.020 1.005 - 1.030   pH, UA 6.0 5.0 - 7.5   Color, UA Yellow Yellow   Appearance Ur Clear  Clear   Leukocytes,UA Negative Negative   Protein,UA Negative Negative/Trace   Glucose, UA Negative Negative   Ketones, UA Negative Negative   RBC, UA Negative Negative   Bilirubin, UA Negative  Negative   Urobilinogen, Ur 0.2 0.2 - 1.0 mg/dL   Nitrite, UA Negative Negative  Testosterone, free, total([facility])  Result Value Ref Range   Testosterone 150 (L) 264 - 916 ng/dL   Testosterone, Free 5.4 (L) 7.2 - 24.0 pg/mL   Sex Hormone Binding 22.5 19.3 - 76.4 nmol/L      Assessment & Plan:   Problem List Items Addressed This Visit   None    LABORATORY TESTING:  Health maintenance labs ordered today as discussed above.   The natural history of prostate cancer and ongoing controversy regarding screening and potential treatment outcomes of prostate cancer has been discussed with the patient. The meaning of a false positive PSA and a false negative PSA has been discussed. He indicates understanding of the limitations of this screening test and wishes  to proceed with screening PSA testing.   IMMUNIZATIONS:   - Tdap: Tetanus vaccination status reviewed: last tetanus booster within 10 years. - Influenza: Up to date - Pneumovax: Not applicable - COVid: Up to date   SCREENING: - Colonoscopy: Up to date  Discussed with patient purpose of the colonoscopy is to detect colon cancer at curable precancerous or early stages   PATIENT COUNSELING:    Sexuality: Discussed sexually transmitted diseases, partner selection, use of condoms, avoidance of unintended pregnancy  and contraceptive alternatives.   Advised to avoid cigarette smoking.  I discussed with the patient that most people either abstain from alcohol or drink within safe limits (<=14/week and <=4 drinks/occasion for males, <=7/weeks and <= 3 drinks/occasion for females) and that the risk for alcohol disorders and other health effects rises proportionally with the number of drinks per week and how often a drinker exceeds  daily limits.  Discussed cessation/primary prevention of drug use and availability of treatment for abuse.   Diet: Encouraged to adjust caloric intake to maintain  or achieve ideal body weight, to reduce intake of dietary saturated fat and total fat, to limit sodium intake by avoiding high sodium foods and not adding table salt, and to maintain adequate dietary potassium and calcium preferably from fresh fruits, vegetables, and low-fat dairy products.    stressed the importance of regular exercise  Injury prevention: Discussed safety belts, safety helmets, smoke detector, smoking near bedding or upholstery.   Dental health: Discussed importance of regular tooth brushing, flossing, and dental visits.   Follow up plan: NEXT PREVENTATIVE PHYSICAL DUE IN 1 YEAR. No follow-ups on file.

## 2020-07-29 NOTE — Assessment & Plan Note (Signed)
Encouraged diet and exercise with goal of losing 1-2lbs per week.  

## 2020-07-30 ENCOUNTER — Encounter: Payer: Self-pay | Admitting: Family Medicine

## 2020-07-30 ENCOUNTER — Other Ambulatory Visit: Payer: Self-pay | Admitting: Family Medicine

## 2020-07-30 MED ORDER — TESTOSTERONE 50 MG/5GM (1%) TD GEL: 10.0000 g | Freq: Every day | TRANSDERMAL | 5 refills | Status: DC

## 2020-07-31 LAB — CBC WITH DIFFERENTIAL/PLATELET
Basophils Absolute: 0.1 10*3/uL (ref 0.0–0.2)
Basos: 1 %
EOS (ABSOLUTE): 0.2 10*3/uL (ref 0.0–0.4)
Eos: 3 %
Hematocrit: 47 % (ref 37.5–51.0)
Hemoglobin: 15.7 g/dL (ref 13.0–17.7)
Immature Grans (Abs): 0 10*3/uL (ref 0.0–0.1)
Immature Granulocytes: 1 %
Lymphocytes Absolute: 3.5 10*3/uL — ABNORMAL HIGH (ref 0.7–3.1)
Lymphs: 51 %
MCH: 30.3 pg (ref 26.6–33.0)
MCHC: 33.4 g/dL (ref 31.5–35.7)
MCV: 91 fL (ref 79–97)
Monocytes Absolute: 0.6 10*3/uL (ref 0.1–0.9)
Monocytes: 9 %
Neutrophils Absolute: 2.4 10*3/uL (ref 1.4–7.0)
Neutrophils: 35 %
Platelets: 434 10*3/uL (ref 150–450)
RBC: 5.19 x10E6/uL (ref 4.14–5.80)
RDW: 13.3 % (ref 11.6–15.4)
WBC: 6.8 10*3/uL (ref 3.4–10.8)

## 2020-07-31 LAB — COMPREHENSIVE METABOLIC PANEL
ALT: 21 IU/L (ref 0–44)
AST: 21 IU/L (ref 0–40)
Albumin/Globulin Ratio: 1.2 (ref 1.2–2.2)
Albumin: 4.2 g/dL (ref 3.8–4.9)
Alkaline Phosphatase: 100 IU/L (ref 44–121)
BUN/Creatinine Ratio: 12 (ref 9–20)
BUN: 12 mg/dL (ref 6–24)
Bilirubin Total: 0.3 mg/dL (ref 0.0–1.2)
CO2: 22 mmol/L (ref 20–29)
Calcium: 9.8 mg/dL (ref 8.7–10.2)
Chloride: 101 mmol/L (ref 96–106)
Creatinine, Ser: 1 mg/dL (ref 0.76–1.27)
Globulin, Total: 3.4 g/dL (ref 1.5–4.5)
Glucose: 99 mg/dL (ref 65–99)
Potassium: 4.5 mmol/L (ref 3.5–5.2)
Sodium: 138 mmol/L (ref 134–144)
Total Protein: 7.6 g/dL (ref 6.0–8.5)
eGFR: 89 mL/min/{1.73_m2} (ref >59)

## 2020-07-31 LAB — TESTOSTERONE, FREE, TOTAL, SHBG
Sex Hormone Binding: 21.4 nmol/L (ref 19.3–76.4)
Testosterone, Free: 6.6 pg/mL — ABNORMAL LOW (ref 7.2–24.0)
Testosterone: 182 ng/dL — ABNORMAL LOW (ref 264–916)

## 2020-07-31 LAB — PSA: Prostate Specific Ag, Serum: 0.8 ng/mL (ref 0.0–4.0)

## 2020-07-31 LAB — LIPID PANEL W/O CHOL/HDL RATIO
Cholesterol, Total: 260 mg/dL — ABNORMAL HIGH (ref 100–199)
HDL: 52 mg/dL (ref >39)
LDL Chol Calc (NIH): 188 mg/dL — ABNORMAL HIGH (ref 0–99)
Triglycerides: 110 mg/dL (ref 0–149)
VLDL Cholesterol Cal: 20 mg/dL (ref 5–40)

## 2020-07-31 LAB — TSH: TSH: 2.27 u[IU]/mL (ref 0.450–4.500)

## 2020-07-31 LAB — HEPATITIS C ANTIBODY: Hep C Virus Ab: <0.1 s/co ratio (ref 0.0–0.9)

## 2020-08-19 ENCOUNTER — Telehealth: Payer: Self-pay

## 2020-08-19 NOTE — Telephone Encounter (Signed)
PA for Testosterone initiated and submitted via Cover My Meds. Key: [id number]

## 2020-08-20 IMAGING — CT CT RENAL STONE PROTOCOL
2 of 4 series · 16 of 46 positions shown, 18 images · non-contrast
Comparison: 09/21/2011

CLINICAL DATA: Right flank pain x2 days, history of kidney stones

EXAM:
CT ABDOMEN AND PELVIS WITHOUT CONTRAST
TECHNIQUE: Multidetector CT imaging of the abdomen and pelvis was performed
following the standard protocol without IV contrast.

[Series 2: stone full standard · axial · 0.82mm/px · z∈[-944,-454]mm · 13 of 108 slices shown, 15 images]
[im 5/108  soft-tissue]
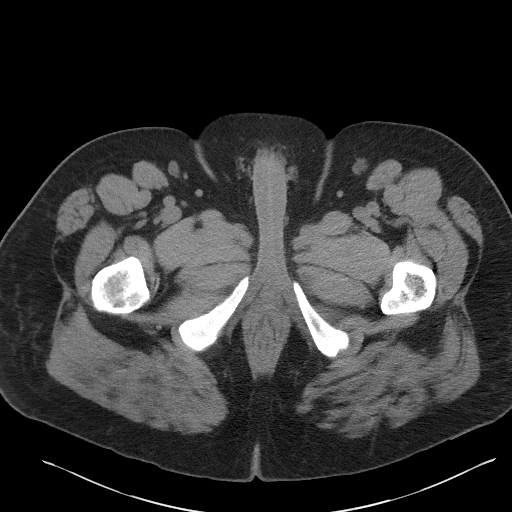
[im 5/108  bone]
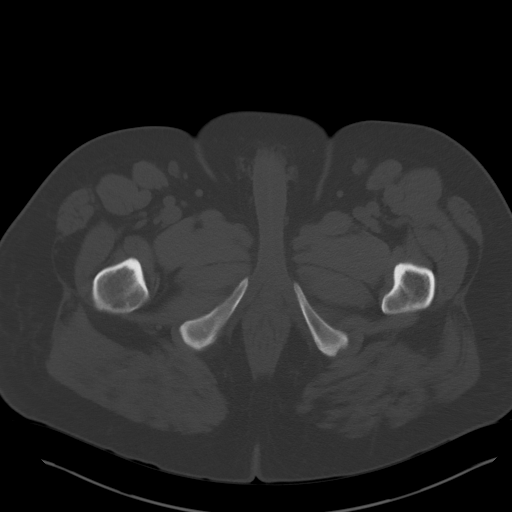
[im 14/108  soft-tissue]
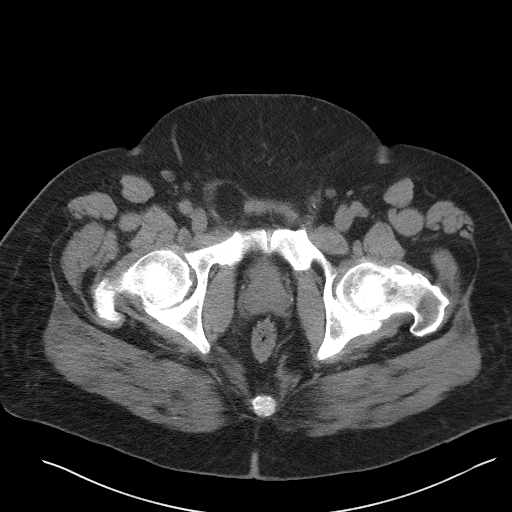
[im 24/108  soft-tissue]
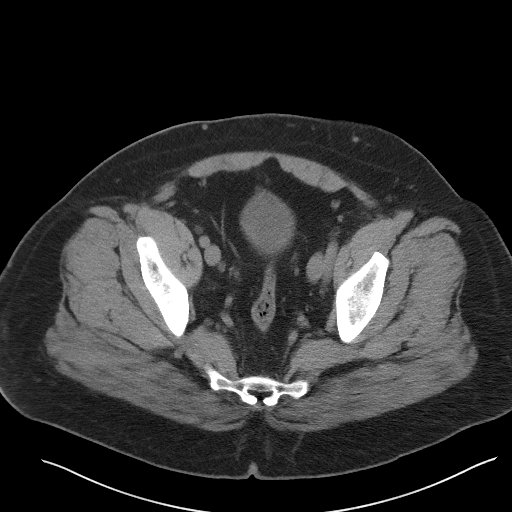
[im 28/108  soft-tissue]
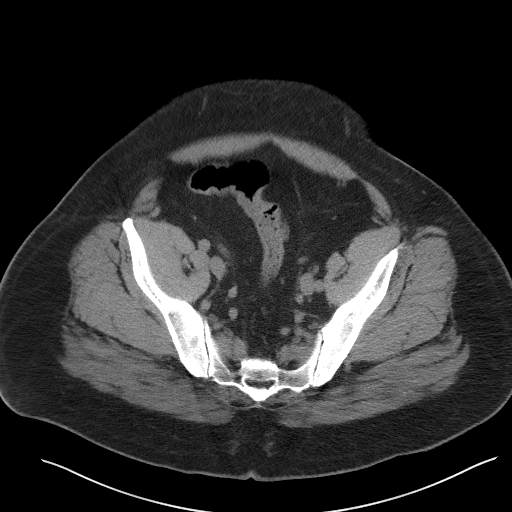
[im 38/108  soft-tissue]
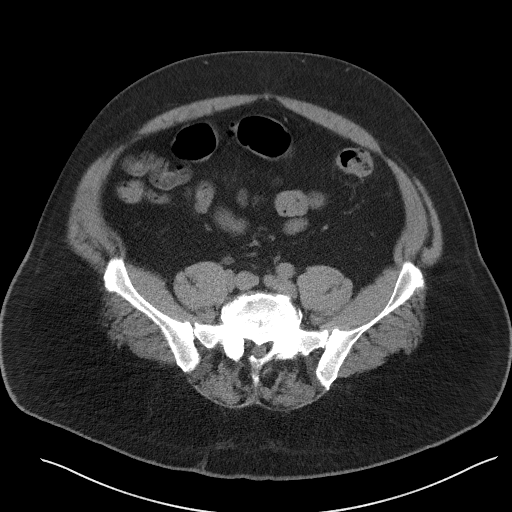
[im 47/108  soft-tissue]
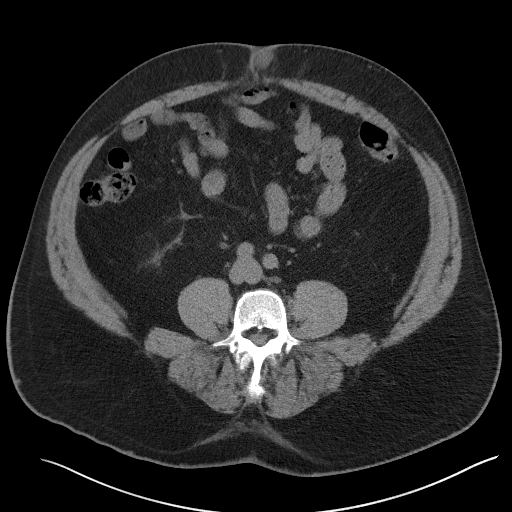
[im 56/108  soft-tissue]
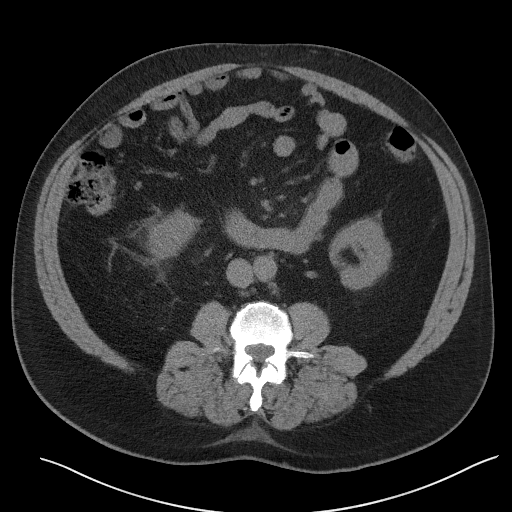
[im 61/108  soft-tissue]
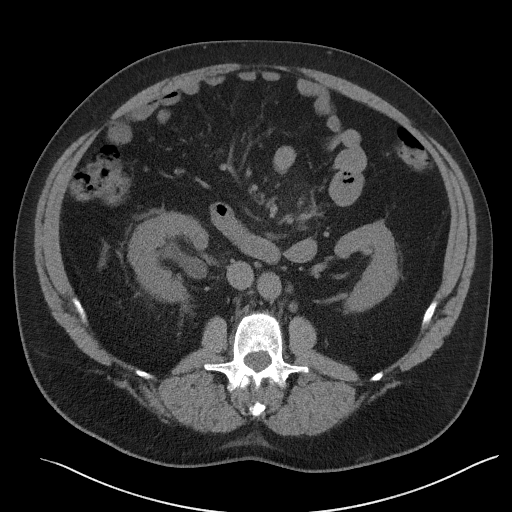
[im 70/108  soft-tissue]
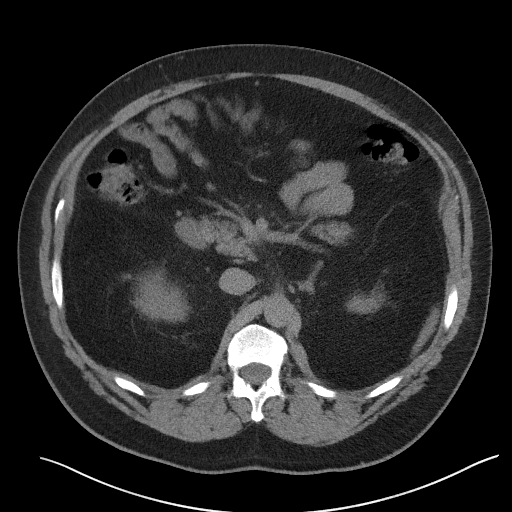
[im 70/108  bone]
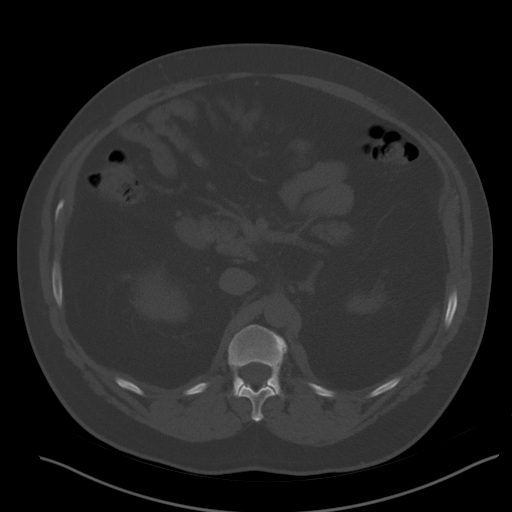
[im 80/108  soft-tissue]
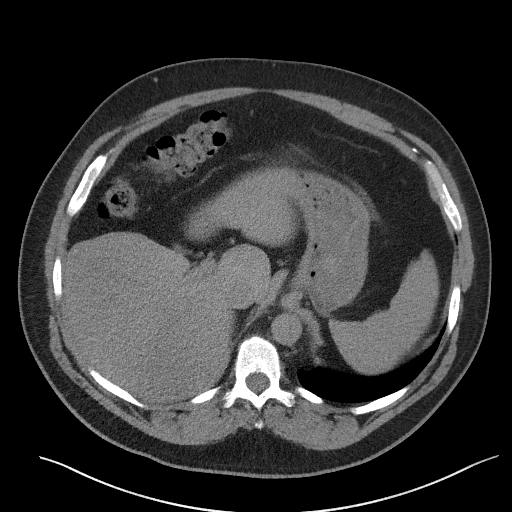
[im 84/108  soft-tissue]
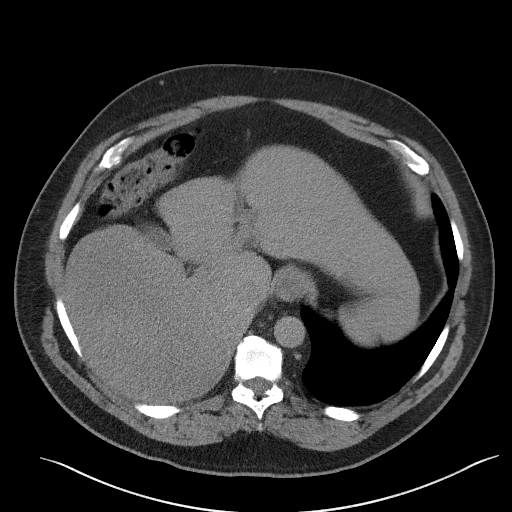
[im 94/108  soft-tissue]
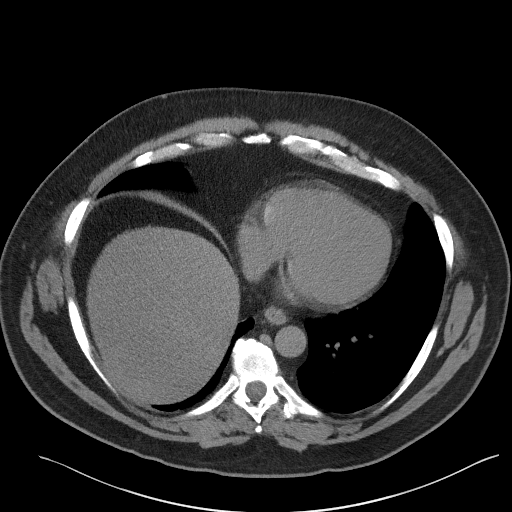
[im 103/108  soft-tissue]
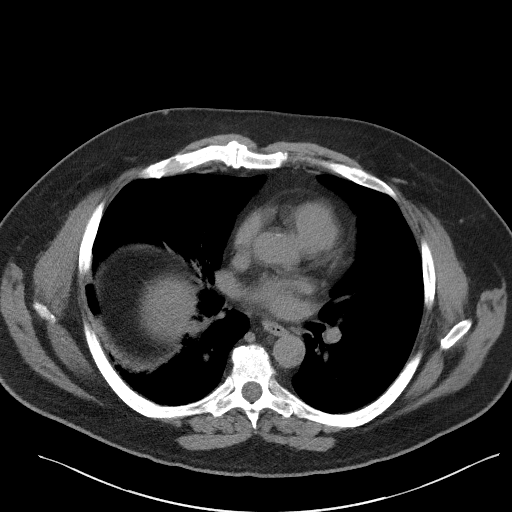

[Series 5: coronal · coronal · 0.92mm/px · 3 of 163 slices shown]
[im 55/163  soft-tissue]
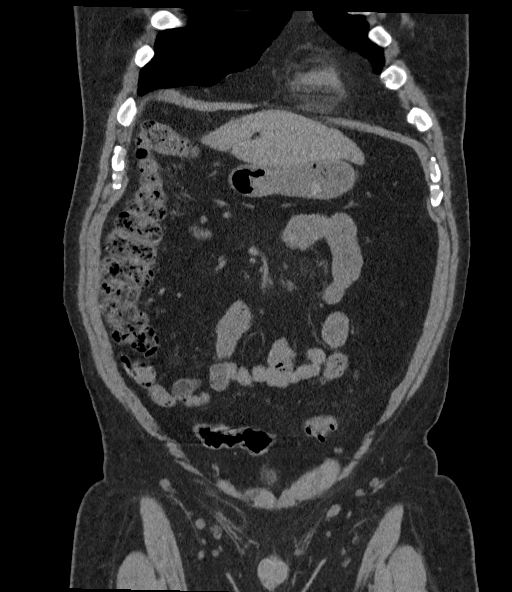
[im 73/163  soft-tissue]
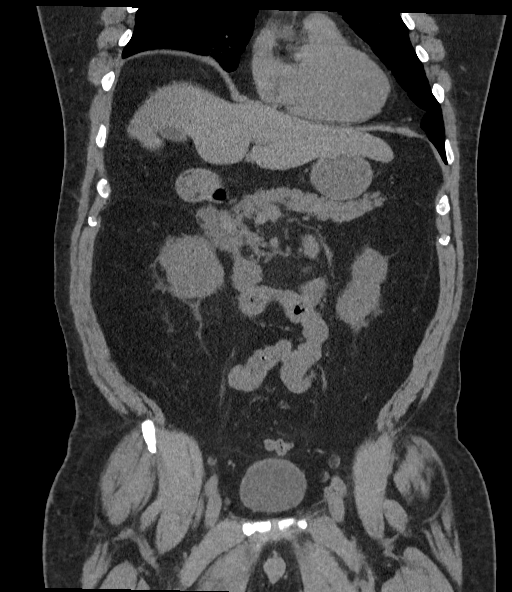
[im 91/163  soft-tissue]
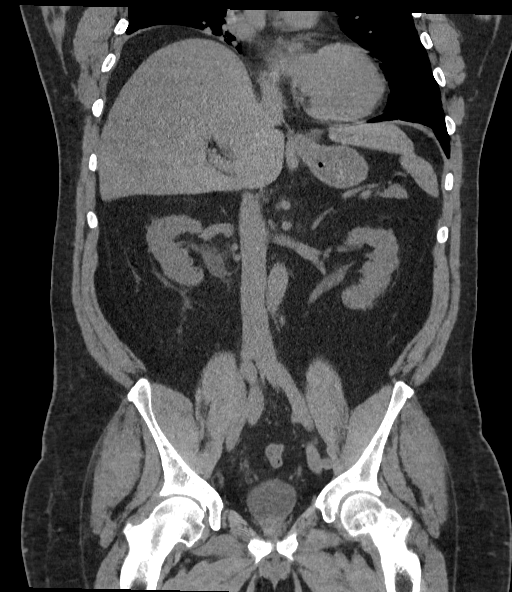

[16 of 46 positions shown; findings below may reference images not displayed]

FINDINGS: Lower chest: Mild eventration of the right hemidiaphragm. Associated
right lower lobe atelectasis.

Hepatobiliary: Mild hepatic steatosis.

Gallbladder is unremarkable. No intrahepatic or extrahepatic ductal
dilatation.

Pancreas: Within normal limits.

Spleen: Within normal limits.

Adrenals/Urinary Tract: Adrenal glands are within normal limits.

2 mm nonobstructing left upper pole renal calculus (series 2/image
45). Right kidney is within normal limits. Mild right
hydronephrosis. Associated 3 mm distal right ureteral calculus just
above the UVJ (coronal image 95).

Bladder is within normal limits.

Stomach/Bowel: Stomach is within normal limits.

No evidence of bowel obstruction.

Normal appendix (series 2/image 67).

Mild sigmoid diverticulosis, without evidence of diverticulitis.

Vascular/Lymphatic: No evidence of abdominal aortic aneurysm.

No suspicious abdominopelvic lymphadenopathy.

Reproductive: Prostate is unremarkable.

Other: No abdominopelvic ascites.

Tiny fat containing right inguinal hernia (series 2/image 96).

Musculoskeletal: Degenerative changes of the visualized
thoracolumbar spine.
IMPRESSION: 3 mm distal right ureteral calculus just above the UVJ. Associated
mild right hydronephrosis.

Additional 2 mm nonobstructing left upper pole renal calculus.

Mild hepatic steatosis.

## 2020-08-20 NOTE — Telephone Encounter (Signed)
PA denied.

## 2020-08-30 ENCOUNTER — Telehealth: Payer: Self-pay

## 2020-08-30 NOTE — Telephone Encounter (Signed)
Copied from [place] [id number]. Topic: General - Other >> Aug 30, 2020  1:24 PM Pawlus, Brayton Layman A wrote: Reason for CRM: Pt stated he has had trouble picking up testosterone (ANDROGEL) 50 MG/5GM (1%), please advise since it looks like the perscription has ended. Pt uses [facility] [place], [place]   Would pt need apt for this or can this be sent? Last apt on 07/29/2020 next on 01/31/2021

## 2020-08-30 NOTE — Telephone Encounter (Signed)
What kind of trouble is the patient having picking up the prescription? Is it the cost or was there a problem to the pharmacy.

## 2020-08-30 NOTE — Telephone Encounter (Signed)
Androgel denied, appt scheduled to discuss treatment options.

## 2020-09-08 ENCOUNTER — Telehealth: Payer: Self-pay

## 2020-09-08 NOTE — Telephone Encounter (Signed)
PA for Testosterone initiated and submitted via Cover My Meds. Key: [id number]

## 2020-09-08 NOTE — Telephone Encounter (Signed)
PA for testosterone 50MG /5GM (1%0 TD Gel has been approved.  Patient is aware to call pharmacy to have prescription re-run.

## 2020-09-09 ENCOUNTER — Ambulatory Visit: Payer: Self-pay | Admitting: Family Medicine

## 2020-09-13 NOTE — Telephone Encounter (Signed)
PA approved.

## 2021-01-15 ENCOUNTER — Other Ambulatory Visit: Payer: Self-pay | Admitting: Family Medicine

## 2021-01-16 NOTE — Telephone Encounter (Signed)
Requested Prescriptions  Pending Prescriptions Disp Refills  . lisinopril (ZESTRIL) 2.5 MG tablet [Pharmacy Med Name: LISINOPRIL 2.5 MG TABLET] 90 tablet 0    Sig: TAKE 1 TABLET BY MOUTH EVERY DAY     Cardiovascular:  ACE Inhibitors Passed - 01/15/2021  8:40 AM      Passed - Cr in normal range and within 180 days    Creatinine  Date Value Ref Range Status  09/21/2011 1.35 (H) 0.60 - 1.30 mg/dL Final   Creatinine, Ser  Date Value Ref Range Status  07/29/2020 1.00 0.76 - 1.27 mg/dL Final         Passed - K in normal range and within 180 days    Potassium  Date Value Ref Range Status  07/29/2020 4.5 3.5 - 5.2 mmol/L Final  09/21/2011 3.0 (L) 3.5 - 5.1 mmol/L Final         Passed - Patient is not pregnant      Passed - Last BP in normal range    BP Readings from Last 1 Encounters:  07/29/20 132/84         Passed - Valid encounter within last 6 months    Recent Outpatient Visits          5 months ago Routine general medical examination at a health care facility   [facility], [place], DO   10 months ago [place] P, DO   11 months ago Depression, major, single episode, moderate ([place])   [place], [place], DO   1 year ago Routine general medical examination at a health care facility   [place], [place], DO   2 years ago Benign hypertensive renal disease   [facility] Valerie Roys, DO      Future Appointments            In 2 weeks Wynetta Emery, Barb Merino, DO [facility]

## 2021-01-31 ENCOUNTER — Ambulatory Visit: Payer: Self-pay | Admitting: Family Medicine

## 2021-02-09 ENCOUNTER — Ambulatory Visit: Payer: Self-pay | Admitting: Family Medicine

## 2021-02-16 ENCOUNTER — Other Ambulatory Visit: Payer: Self-pay | Admitting: Family Medicine

## 2021-02-16 NOTE — Telephone Encounter (Signed)
Requested medication (s) are due for refill today: yes  Requested medication (s) are on the active medication list: yes    Last refill: 07/30/20   30G  5 refills  Future visit scheduled no  Notes to clinic:off protocol  Requested Prescriptions  Pending Prescriptions Disp Refills   testosterone (ANDROGEL) 50 MG/5GM (1%) GEL [Pharmacy Med Name: TESTOSTERONE 50 MG/5 GRAM GEL] 300 g     Sig: Place 10 g onto the skin daily.     Off-Protocol Failed - 02/16/2021  1:33 AM      Failed - Medication not assigned to a protocol, review manually.      Passed - Valid encounter within last 12 months    Recent Outpatient Visits           6 months ago Routine general medical examination at a health care facility   [facility], [place], DO   11 months ago COVID-19   [place], DO   1 year ago Depression, major, single episode, moderate [facility])   [place], [place], DO   1 year ago Routine general medical examination at a health care facility   [facility], [place], DO   2 years ago Benign hypertensive renal disease   [facility] [place], [place], DO

## 2021-02-17 ENCOUNTER — Other Ambulatory Visit: Payer: Self-pay | Admitting: Family Medicine

## 2021-02-17 NOTE — Telephone Encounter (Addendum)
Refill given due to follow up on file 1 year per last OV notes

## 2021-04-12 ENCOUNTER — Other Ambulatory Visit: Payer: Self-pay | Admitting: Family Medicine

## 2021-04-12 NOTE — Telephone Encounter (Signed)
Requested Prescriptions  Pending Prescriptions Disp Refills   amLODipine (NORVASC) 5 MG tablet [Pharmacy Med Name: AMLODIPINE BESYLATE 5 MG TAB] 30 tablet 0    Sig: TAKE 1 TABLET (5 MG TOTAL) BY MOUTH DAILY.     Cardiovascular: Calcium Channel Blockers 2 Failed - 04/12/2021  1:45 AM      Failed - Valid encounter within last 6 months    Recent Outpatient Visits          8 months ago Routine general medical examination at a health care facility   [facility], [place], DO   1 year ago [place], Megan P, DO   1 year ago Depression, major, single episode, moderate ([place])   [place], [place], DO   1 year ago Routine general medical examination at a health care facility   [facility], [place], DO   2 years ago Benign hypertensive renal disease   [facility] [place], [place] P, DO             Passed - Last BP in normal range    BP Readings from Last 1 Encounters:  07/29/20 132/84         Passed - Last Heart Rate in normal range    Pulse Readings from Last 1 Encounters:  07/29/20 68

## 2021-04-13 ENCOUNTER — Other Ambulatory Visit: Payer: Self-pay | Admitting: Family Medicine

## 2021-04-13 NOTE — Telephone Encounter (Signed)
Requested medication (s) are due for refill today: Yes  Requested medication (s) are on the active medication list: Yes  Last refill:  01/16/21  Future visit scheduled: No  Notes to clinic:  Pt. States he will call back and make an appointment.    Requested Prescriptions  Pending Prescriptions Disp Refills   lisinopril (ZESTRIL) 2.5 MG tablet [Pharmacy Med Name: LISINOPRIL 2.5 MG TABLET] 90 tablet 0    Sig: TAKE 1 TABLET BY MOUTH EVERY DAY     Cardiovascular:  ACE Inhibitors Failed - 04/13/2021  1:46 AM      Failed - Cr in normal range and within 180 days    Creatinine  Date Value Ref Range Status  09/21/2011 1.35 (H) 0.60 - 1.30 mg/dL Final   Creatinine, Ser  Date Value Ref Range Status  07/29/2020 1.00 0.76 - 1.27 mg/dL Final          Failed - K in normal range and within 180 days    Potassium  Date Value Ref Range Status  07/29/2020 4.5 3.5 - 5.2 mmol/L Final  09/21/2011 3.0 (L) 3.5 - 5.1 mmol/L Final          Failed - Valid encounter within last 6 months    Recent Outpatient Visits           8 months ago Routine general medical examination at a health care facility   [facility], [place], DO   1 year ago [place], DO   1 year ago Depression, major, single episode, moderate ([place])   [place], [place], DO   1 year ago Routine general medical examination at a health care facility   [facility], [place], DO   2 years ago Benign hypertensive renal disease   [place], [place], DO              Passed - Patient is not pregnant      Passed - Last BP in normal range    BP Readings from Last 1 Encounters:  07/29/20 132/84

## 2021-04-13 NOTE — Telephone Encounter (Signed)
Called patient to schedule appt. Patient working and unable to schedule appt at this time . Reports he will call back.

## 2021-04-14 NOTE — Telephone Encounter (Signed)
Called pt to schedule an appt

## 2021-04-18 ENCOUNTER — Encounter: Payer: Self-pay | Admitting: Family Medicine

## 2021-04-18 NOTE — Telephone Encounter (Signed)
3rd attempt- Called pt and someone answered. Asked if I could speak with pt and they hung up the phone. Sending letter.

## 2021-05-07 ENCOUNTER — Other Ambulatory Visit: Payer: Self-pay | Admitting: Family Medicine

## 2021-05-09 NOTE — Telephone Encounter (Signed)
Patient last seen June 2022 ?

## 2021-05-09 NOTE — Telephone Encounter (Signed)
Requested medication (s) are due for refill today: yes ? ?Requested medication (s) are on the active medication list: yes ? ?Last refill:  04/12/21 #30 with 0 RF ? ?Future visit scheduled: no ? ?Notes to clinic: Has already had a curtesy refill and there is no upcoming appointment scheduled. ? ? ? ?  ? ?Requested Prescriptions  ?Pending Prescriptions Disp Refills  ? amLODipine (NORVASC) 5 MG tablet [Pharmacy Med Name: AMLODIPINE BESYLATE 5 MG TAB] 30 tablet 0  ?  Sig: TAKE 1 TABLET (5 MG TOTAL) BY MOUTH DAILY.  ?  ? Cardiovascular: Calcium Channel Blockers 2 Failed - 05/07/2021 10:35 AM  ?  ?  Failed - Valid encounter within last 6 months  ?  Recent Outpatient Visits   ? ?      ? 9 months ago Routine general medical examination at a health care facility  ? Leonard, Megan P, DO  ? 1 year ago COVID-19  ? [place] P, DO  ? 1 year ago Depression, major, single episode, moderate ([place])  ? [place], DO  ? 1 year ago Routine general medical examination at a health care facility  ? [place], [place] P, DO  ? 2 years ago Benign hypertensive renal disease  ? [place], DO  ? ?  ?  ? ?  ?  ?  Passed - Last BP in normal range  ?  BP Readings from Last 1 Encounters:  ?07/29/20 132/84  ?  ?  ?  ?  Passed - Last Heart Rate in normal range  ?  Pulse Readings from Last 1 Encounters:  ?07/29/20 68  ?  ?  ?  ?  ? ? ?

## 2021-05-09 NOTE — Telephone Encounter (Signed)
Needs appt

## 2021-05-10 NOTE — Telephone Encounter (Signed)
Pt states he has switched doctors ?

## 2021-07-19 ENCOUNTER — Other Ambulatory Visit: Payer: Self-pay

## 2021-07-19 ENCOUNTER — Emergency Department (EMERGENCY_DEPARTMENT_HOSPITAL)
Admission: EM | Admit: 2021-07-19 | Discharge: 2021-07-20 | Disposition: A | Discharge status: Other Acute Inpatient Hospital | Payer: BLUE CROSS/BLUE SHIELD | Source: Home / Self Care | Attending: Emergency Medicine | Admitting: Emergency Medicine

## 2021-07-19 DIAGNOSIS — Y900 Blood alcohol level of less than 20 mg/100 ml: Secondary | ICD-10-CM | POA: Insufficient documentation

## 2021-07-19 DIAGNOSIS — Z79899 Other long term (current) drug therapy: Secondary | ICD-10-CM | POA: Insufficient documentation

## 2021-07-19 DIAGNOSIS — F329 Major depressive disorder, single episode, unspecified: Secondary | ICD-10-CM | POA: Insufficient documentation

## 2021-07-19 DIAGNOSIS — G4730 Sleep apnea, unspecified: Secondary | ICD-10-CM | POA: Diagnosis present

## 2021-07-19 DIAGNOSIS — F332 Major depressive disorder, recurrent severe without psychotic features: Secondary | ICD-10-CM | POA: Diagnosis not present

## 2021-07-19 DIAGNOSIS — R45851 Suicidal ideations: Secondary | ICD-10-CM | POA: Insufficient documentation

## 2021-07-19 DIAGNOSIS — F321 Major depressive disorder, single episode, moderate: Secondary | ICD-10-CM | POA: Diagnosis present

## 2021-07-19 DIAGNOSIS — E785 Hyperlipidemia, unspecified: Secondary | ICD-10-CM | POA: Insufficient documentation

## 2021-07-19 DIAGNOSIS — F32A Depression, unspecified: Secondary | ICD-10-CM

## 2021-07-19 DIAGNOSIS — Z20822 Contact with and (suspected) exposure to covid-19: Secondary | ICD-10-CM | POA: Insufficient documentation

## 2021-07-19 DIAGNOSIS — I129 Hypertensive chronic kidney disease with stage 1 through stage 4 chronic kidney disease, or unspecified chronic kidney disease: Secondary | ICD-10-CM | POA: Insufficient documentation

## 2021-07-19 DIAGNOSIS — N181 Chronic kidney disease, stage 1: Secondary | ICD-10-CM | POA: Insufficient documentation

## 2021-07-19 LAB — CBC
HCT: 47.8 % (ref 39.0–52.0)
Hemoglobin: 15.9 g/dL (ref 13.0–17.0)
MCH: 30.3 pg (ref 26.0–34.0)
MCHC: 33.3 g/dL (ref 30.0–36.0)
MCV: 91.2 fL (ref 80.0–100.0)
Platelets: 426 K/uL — ABNORMAL HIGH (ref 150–400)
RBC: 5.24 MIL/uL (ref 4.22–5.81)
RDW: 13.1 % (ref 11.5–15.5)
WBC: 8.8 K/uL (ref 4.0–10.5)
nRBC: 0 % (ref 0.0–0.2)

## 2021-07-19 LAB — URINE DRUG SCREEN, QUALITATIVE (ARMC ONLY)
Amphetamines, Ur Screen: NOT DETECTED
Barbiturates, Ur Screen: NOT DETECTED
Benzodiazepine, Ur Scrn: NOT DETECTED
Cannabinoid 50 Ng, Ur ~~LOC~~: NOT DETECTED
Cocaine Metabolite,Ur ~~LOC~~: NOT DETECTED
MDMA (Ecstasy)Ur Screen: NOT DETECTED
Methadone Scn, Ur: NOT DETECTED
Opiate, Ur Screen: NOT DETECTED
Phencyclidine (PCP) Ur S: NOT DETECTED
Tricyclic, Ur Screen: NOT DETECTED

## 2021-07-19 LAB — COMPREHENSIVE METABOLIC PANEL WITH GFR
ALT: 28 U/L (ref 0–44)
AST: 28 U/L (ref 15–41)
Albumin: 4.3 g/dL (ref 3.5–5.0)
Alkaline Phosphatase: 77 U/L (ref 38–126)
Anion gap: 12 (ref 5–15)
BUN: 17 mg/dL (ref 6–20)
CO2: 27 mmol/L (ref 22–32)
Calcium: 9.4 mg/dL (ref 8.9–10.3)
Chloride: 99 mmol/L (ref 98–111)
Creatinine, Ser: 1.32 mg/dL — ABNORMAL HIGH (ref 0.61–1.24)
GFR, Estimated: >60 mL/min
Glucose, Bld: 109 mg/dL — ABNORMAL HIGH (ref 70–99)
Potassium: 3.4 mmol/L — ABNORMAL LOW (ref 3.5–5.1)
Sodium: 138 mmol/L (ref 135–145)
Total Bilirubin: 0.7 mg/dL (ref 0.3–1.2)
Total Protein: 8.2 g/dL — ABNORMAL HIGH (ref 6.5–8.1)

## 2021-07-19 LAB — RESP PANEL BY RT-PCR (FLU A&B, COVID) ARPGX2
Influenza A by PCR: NEGATIVE
Influenza B by PCR: NEGATIVE
SARS Coronavirus 2 by RT PCR: NEGATIVE

## 2021-07-19 LAB — ETHANOL: Alcohol, Ethyl (B): <10 mg/dL (ref <10)

## 2021-07-19 LAB — SALICYLATE LEVEL: Salicylate Lvl: <7 mg/dL — ABNORMAL LOW (ref 7.0–30.0)

## 2021-07-19 LAB — ACETAMINOPHEN LEVEL: Acetaminophen (Tylenol), Serum: <10 ug/mL — ABNORMAL LOW (ref 10–30)

## 2021-07-19 NOTE — ED Notes (Signed)
Patient transferred from ED to [facility] room 1 after screening for contraband. Report received from Maudie Mercury, RN including Situation, Background, Assessment and Recommendations. Pt oriented to unit including Q15 minute rounds as well as the security cameras for their protection. Patient is alert and oriented, warm and dry in no acute distress. Patient denies SI, HI, and AVH. Pt. Encouraged to let this nurse know if needs arise.

## 2021-07-19 NOTE — ED Notes (Signed)
Pt refused snack 
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
## 2021-07-19 NOTE — ED Notes (Signed)
Pt dressed out into hospital attire. Pt belongings to include: 1 black shirt 1 brown pants 2 black shoes 2 black socks 1 blue boxers
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
## 2021-07-20 ENCOUNTER — Other Ambulatory Visit: Payer: Self-pay

## 2021-07-20 ENCOUNTER — Encounter: Payer: Self-pay | Admitting: Psychiatry

## 2021-07-20 ENCOUNTER — Inpatient Hospital Stay
Admission: AD | Admit: 2021-07-20 | Discharge: 2021-07-21 | DRG: 885 | Disposition: A | Discharge status: Home / Self Care | Payer: BLUE CROSS/BLUE SHIELD | Source: Intra-hospital | Attending: Psychiatry | Admitting: Psychiatry

## 2021-07-20 DIAGNOSIS — Z20822 Contact with and (suspected) exposure to covid-19: Secondary | ICD-10-CM | POA: Diagnosis present

## 2021-07-20 DIAGNOSIS — F332 Major depressive disorder, recurrent severe without psychotic features: Secondary | ICD-10-CM | POA: Diagnosis present

## 2021-07-20 DIAGNOSIS — N181 Chronic kidney disease, stage 1: Secondary | ICD-10-CM | POA: Diagnosis present

## 2021-07-20 DIAGNOSIS — Z7951 Long term (current) use of inhaled steroids: Secondary | ICD-10-CM | POA: Diagnosis not present

## 2021-07-20 DIAGNOSIS — Z8249 Family history of ischemic heart disease and other diseases of the circulatory system: Secondary | ICD-10-CM

## 2021-07-20 DIAGNOSIS — R45851 Suicidal ideations: Secondary | ICD-10-CM | POA: Diagnosis present

## 2021-07-20 DIAGNOSIS — Z79890 Hormone replacement therapy (postmenopausal): Secondary | ICD-10-CM

## 2021-07-20 DIAGNOSIS — F4325 Adjustment disorder with mixed disturbance of emotions and conduct: Secondary | ICD-10-CM | POA: Diagnosis present

## 2021-07-20 DIAGNOSIS — E669 Obesity, unspecified: Secondary | ICD-10-CM | POA: Diagnosis present

## 2021-07-20 DIAGNOSIS — F321 Major depressive disorder, single episode, moderate: Secondary | ICD-10-CM | POA: Diagnosis not present

## 2021-07-20 DIAGNOSIS — Z83438 Family history of other disorder of lipoprotein metabolism and other lipidemia: Secondary | ICD-10-CM | POA: Diagnosis not present

## 2021-07-20 DIAGNOSIS — I129 Hypertensive chronic kidney disease with stage 1 through stage 4 chronic kidney disease, or unspecified chronic kidney disease: Secondary | ICD-10-CM | POA: Diagnosis present

## 2021-07-20 DIAGNOSIS — E785 Hyperlipidemia, unspecified: Secondary | ICD-10-CM | POA: Diagnosis present

## 2021-07-20 DIAGNOSIS — Z91030 Bee allergy status: Secondary | ICD-10-CM

## 2021-07-20 DIAGNOSIS — Z6837 Body mass index (BMI) 37.0-37.9, adult: Secondary | ICD-10-CM | POA: Diagnosis not present

## 2021-07-20 DIAGNOSIS — Z79899 Other long term (current) drug therapy: Secondary | ICD-10-CM

## 2021-07-20 DIAGNOSIS — E291 Testicular hypofunction: Secondary | ICD-10-CM | POA: Diagnosis present

## 2021-07-20 DIAGNOSIS — G4730 Sleep apnea, unspecified: Secondary | ICD-10-CM | POA: Diagnosis present

## 2021-07-20 MED ORDER — MOMETASONE FURO-FORMOTEROL FUM 200-5 MCG/ACT IN AERO
2.0000 | INHALATION_SPRAY | Freq: Two times a day (BID) | RESPIRATORY_TRACT | Status: DC
  Administered 2021-07-20 – 2021-07-21 (×2): 2 via RESPIRATORY_TRACT
  Filled 2021-07-20: qty 8.8

## 2021-07-20 MED ORDER — ROSUVASTATIN CALCIUM 10 MG PO TABS
10.0000 mg | ORAL_TABLET | Freq: Every day | ORAL | Status: DC
  Administered 2021-07-21: 10 mg via ORAL
  Filled 2021-07-20: qty 1

## 2021-07-20 MED ORDER — ACETAMINOPHEN 325 MG PO TABS: 650.0000 mg | ORAL_TABLET | Freq: Four times a day (QID) | ORAL | Status: DC | PRN

## 2021-07-20 MED ORDER — ALBUTEROL SULFATE (2.5 MG/3ML) 0.083% IN NEBU: 3.0000 mL | INHALATION_SOLUTION | RESPIRATORY_TRACT | Status: DC | PRN

## 2021-07-20 MED ORDER — ALBUTEROL SULFATE HFA 108 (90 BASE) MCG/ACT IN AERS: 2.0000 | INHALATION_SPRAY | RESPIRATORY_TRACT | Status: DC | PRN

## 2021-07-20 MED ORDER — LORATADINE 10 MG PO TABS
10.0000 mg | ORAL_TABLET | Freq: Every day | ORAL | Status: DC
  Administered 2021-07-20: 10 mg via ORAL
  Filled 2021-07-20: qty 1

## 2021-07-20 MED ORDER — LISINOPRIL 5 MG PO TABS
5.0000 mg | ORAL_TABLET | Freq: Every day | ORAL | Status: DC
Start: 2021-07-20 — End: 2021-07-20
  Administered 2021-07-20: 5 mg via ORAL
  Filled 2021-07-20: qty 1

## 2021-07-20 MED ORDER — LISINOPRIL 5 MG PO TABS
5.0000 mg | ORAL_TABLET | Freq: Every day | ORAL | Status: DC
  Administered 2021-07-21: 5 mg via ORAL
  Filled 2021-07-20: qty 1

## 2021-07-20 MED ORDER — MOMETASONE FURO-FORMOTEROL FUM 200-5 MCG/ACT IN AERO
2.0000 | INHALATION_SPRAY | Freq: Two times a day (BID) | RESPIRATORY_TRACT | Status: DC
  Filled 2021-07-20: qty 8.8

## 2021-07-20 MED ORDER — SERTRALINE HCL 100 MG PO TABS
100.0000 mg | ORAL_TABLET | Freq: Every day | ORAL | Status: DC
Start: 2021-07-21 — End: 2021-07-21
  Administered 2021-07-21: 100 mg via ORAL
  Filled 2021-07-20: qty 1

## 2021-07-20 MED ORDER — LORATADINE 10 MG PO TABS
10.0000 mg | ORAL_TABLET | Freq: Every day | ORAL | Status: DC
  Administered 2021-07-21: 10 mg via ORAL
  Filled 2021-07-20: qty 1

## 2021-07-20 MED ORDER — TESTOSTERONE 50 MG/5GM (1%) TD GEL: 10.0000 g | Freq: Every day | TRANSDERMAL | Status: DC

## 2021-07-20 MED ORDER — ROSUVASTATIN CALCIUM 10 MG PO TABS
10.0000 mg | ORAL_TABLET | Freq: Every day | ORAL | Status: DC
  Filled 2021-07-20: qty 1

## 2021-07-20 MED ORDER — SERTRALINE HCL 100 MG PO TABS
100.0000 mg | ORAL_TABLET | Freq: Every day | ORAL | Status: DC
  Administered 2021-07-20: 100 mg via ORAL
  Filled 2021-07-20: qty 1

## 2021-07-20 MED ORDER — ALUM & MAG HYDROXIDE-SIMETH 200-200-20 MG/5ML PO SUSP: 30.0000 mL | ORAL | Status: DC | PRN

## 2021-07-20 MED ORDER — MAGNESIUM HYDROXIDE 400 MG/5ML PO SUSP: 30.0000 mL | Freq: Every day | ORAL | Status: DC | PRN

## 2021-07-20 NOTE — ED Notes (Signed)
Patient given lunch tray.
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
## 2021-07-20 NOTE — BH Assessment (Addendum)
Referral information for Psychiatric Hospitalization faxed to;  [facility] ([phone]- [phone]),   [facility] [phone]), Facility reports no beds available at this time  [facility] [phone]),    [place] [phone] -or- [phone]),   [facility] [phone]).

## 2021-07-20 NOTE — ED Notes (Signed)
Report received from Christopher B, RN including SBAR. On initial round after report Pt is warm/dry, resting quietly in room without any s/s of distress.  Will continue to monitor throughout shift as ordered for any changes in behaviors and for continued safety.   

## 2021-07-20 NOTE — Plan of Care (Signed)
New admission.   Problem: Education: Goal: Knowledge of [facility] Education information/materials will improve Outcome: Not Progressing Goal: Emotional status will improve Outcome: Not Progressing Goal: Mental status will improve Outcome: Not Progressing Goal: Verbalization of understanding the information provided will improve Outcome: Not Progressing   Problem: Safety: Goal: Periods of time without injury will increase Outcome: Not Progressing   Problem: Coping: Goal: Ability to identify and develop effective coping behavior will improve Outcome: Not Progressing   Problem: Self-Concept: Goal: Level of anxiety will decrease Outcome: Not Progressing   Problem: Coping: Goal: Coping ability will improve Outcome: Not Progressing   Problem: Medication: Goal: Compliance with prescribed medication regimen will improve Outcome: Not Progressing
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
## 2021-07-20 NOTE — BH Assessment (Signed)
Patient is to be admitted to [facility] today 07/20/21 at 3:30pm by Dr. Weber Cooks.  Attending Physician will be Dr.  Weber Cooks .   Patient has been assigned to room 304, by [place], Junita Push.     ER staff is aware of the admission: Apolonio Schneiders, ER Secretary   Dr. Jacqualine Code, ER MD  Joellen Jersey, Patient's Nurse  Seth Bake, Patient Access.

## 2021-07-20 NOTE — ED Notes (Signed)
Patient given snack.  

## 2021-07-20 NOTE — Tx Team (Addendum)
Initial Treatment Plan 07/20/2021 4:49 PM JONPAUL LUMM [id number]    PATIENT STRESSORS: Marital or family conflict     PATIENT STRENGTHS: Ability for insight  Average or above average intelligence  Communication skills  General fund of knowledge  Supportive family/friends  Work skills    PATIENT IDENTIFIED PROBLEMS: SI prior to admission  Marital Conflict  Anxiety                 DISCHARGE CRITERIA:  Ability to meet basic life and health needs Improved stabilization in mood, thinking, and/or behavior Need for constant or close observation no longer present Reduction of life-threatening or endangering symptoms to within safe limits  PRELIMINARY DISCHARGE PLAN: Outpatient therapy Return to previous living arrangement Return to previous work or school arrangements  PATIENT/FAMILY INVOLVEMENT: This treatment plan has been presented to and reviewed with the patient, SAVOY [place]. The patient has been given the opportunity to ask questions and make suggestions.  Collier Bohnet, RN 07/20/2021, 4:49 PM

## 2021-07-20 NOTE — BH Assessment (Signed)
Patient is under review with [facility]. Awaiting accepting details.  

## 2021-07-20 NOTE — ED Notes (Signed)
Breakfast tray given at 0900

## 2021-07-20 NOTE — ED Notes (Signed)
Pt. Denied shower

## 2021-07-20 NOTE — ED Provider Notes (Signed)
Emergency Medicine Observation Re-evaluation Note  Eric Blevins is a 56 y.o. male, seen on rounds today.  Pt initially presented to the ED for complaints of IVC Currently, the patient is just getting up from resting, in no distress, reports no concerns but is supposed to be on home medications that he gets through [facility], and reports he is not.  He is not certain of all his medications.  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  Recent changes in the last 24 hours include no movement noted major changes.  Plan  Current plan is for psychiatric disposition. Eric Blevins is under involuntary commitment.      Delman Kitten, MD 07/20/21 [phone]

## 2021-07-20 NOTE — ED Provider Notes (Signed)
Reconciliation of home meds has been completed.  I have reordered majority, some meds I have held as the patient is normotensive.     Delman Kitten, MD 07/20/21 1226

## 2021-07-20 NOTE — Plan of Care (Signed)
Pt had a visit from his wife and the visit went well. Pt denies SI/HI and AVH. Anxiety score 8/10, because of the situation he is in with his wife, and financial stressors. Pt denies having any depression. Pt stated he was having a relationship with another woman on his phone. He denied having a sexual relationship with her, but his wife found out about it , because she read his text messages. He stated he was suicidal because she was threatening divorce and he thought he would loss everything. Pt contracts for safety and he hopes to be discharged soon in ordered to handle some business matters. Pt went to bed after his wife left and no complaints. No signs of respiratory distress. Problem: Education: Goal: Knowledge of [place] [facility] Education information/materials will improve Outcome: Progressing Goal: Emotional status will improve Outcome: Progressing Goal: Mental status will improve Outcome: Progressing Goal: Verbalization of understanding the information provided will improve Outcome: Progressing   Problem: Safety: Goal: Periods of time without injury will increase Outcome: Progressing   Problem: Coping: Goal: Ability to identify and develop effective coping behavior will improve Outcome: Progressing   Problem: Self-Concept: Goal: Level of anxiety will decrease Outcome: Progressing   Problem: Coping: Goal: Coping ability will improve Outcome: Progressing   Problem: Medication: Goal: Compliance with prescribed medication regimen will improve Outcome: Progressing
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
## 2021-07-20 NOTE — ED Notes (Signed)
INVOLUNTARY with consult done referrals faxed out for placement

## 2021-07-20 NOTE — ED Notes (Signed)
Informed pt of transfer downstairs to IP tx.  Pt is agreeable.

## 2021-07-20 NOTE — ED Notes (Signed)
Received verbal permission from Eric Blevins to speak with pt family.  Staff spoke to Wife and she stated that Eric Blevins had called and stated to her and her daughter that he has had "no food or water since I've been here"  Staff assured family that patient has indeed had meals, snacks, waters, and soda to drink and eat.  Staff explained to pt family that we are currently seeking an inpatient bed so that pt can obtain additional tx.

## 2021-07-21 DIAGNOSIS — F4325 Adjustment disorder with mixed disturbance of emotions and conduct: Secondary | ICD-10-CM | POA: Diagnosis not present

## 2021-07-21 NOTE — Progress Notes (Signed)
Recreation Therapy Notes    Date: 07/21/2021  Time: 10:15am   Location: Courtyard       Behavioral response: N/A   Intervention Topic: Social-Skills   Discussion/Intervention: Patient refused to attend group.   Clinical Observations/Feedback:  Patient refused to attend group.    Eric Blevins LRT/CTRS         Abdulwahab Demelo 07/21/2021 11:47 AM

## 2021-07-21 NOTE — BHH Suicide Risk Assessment (Signed)
[facility] Discharge Suicide Risk Assessment   Principal Problem: Adjustment disorder with mixed disturbance of emotions and conduct Discharge Diagnoses: Principal Problem:   Adjustment disorder with mixed disturbance of emotions and conduct   Total Time spent with patient: 1 hour  Musculoskeletal: Strength & Muscle Tone: within normal limits Gait & Station: normal Patient leans: N/A  Psychiatric Specialty Exam  Presentation  General Appearance: Appropriate for Environment  Eye Contact:Good  Speech:Clear and Coherent  Speech Volume:Normal  Handedness:Right   Mood and Affect  Mood:Anxious  Duration of Depression Symptoms: Greater than two weeks  Affect:Depressed   Thought Process  Thought Processes:Coherent  Descriptions of Associations:Intact  Orientation:Full (Time, Place and Person)  Thought Content:Logical  History of Schizophrenia/Schizoaffective disorder:No  Duration of Psychotic Symptoms:No data recorded Hallucinations:No data recorded Ideas of Reference:None  Suicidal Thoughts:No data recorded Homicidal Thoughts:No data recorded  Sensorium  Memory:Immediate Good; Remote Good; Recent Good  Judgment:Fair  Insight:Fair   Executive Functions  Concentration:Good  Attention Span:Good  [place] of Knowledge:Fair  Language:Fair   Psychomotor Activity  Psychomotor Activity:No data recorded  Assets  Assets:Communication Skills; Desire for Improvement; Social Support; Physical Health   Sleep  Sleep:No data recorded  Physical Exam: Physical Exam Constitutional:      Appearance: Normal appearance.  HENT:     Head: Normocephalic and atraumatic.     Mouth/Throat:     Pharynx: Oropharynx is clear.  Eyes:     Pupils: Pupils are equal, round, and reactive to light.  Cardiovascular:     Rate and Rhythm: Normal rate and regular rhythm.  Pulmonary:     Effort: Pulmonary effort is normal.     Breath sounds: Normal breath sounds.   Abdominal:     General: Abdomen is flat.     Palpations: Abdomen is soft.  Musculoskeletal:        General: Normal range of motion.  Skin:    General: Skin is warm and dry.  Neurological:     General: No focal deficit present.     Mental Status: He is alert. Mental status is at baseline.  Psychiatric:        Attention and Perception: Attention normal.        Mood and Affect: Mood normal.        Speech: Speech normal.        Behavior: Behavior is cooperative.        Thought Content: Thought content normal.        Cognition and Memory: Cognition normal.        Judgment: Judgment normal.   Review of Systems  Constitutional: Negative.   HENT: Negative.    Eyes: Negative.   Respiratory: Negative.    Cardiovascular: Negative.   Gastrointestinal: Negative.   Musculoskeletal: Negative.   Skin: Negative.   Neurological: Negative.   Psychiatric/Behavioral:  Negative for depression, hallucinations, substance abuse and suicidal ideas. The patient is nervous/anxious and has insomnia.   Blood pressure 110/81, pulse 92, temperature 97.8 F (36.6 C), temperature source Oral, resp. rate 19, height '5\' 9"'$  (1.753 m), weight 116.6 kg, SpO2 97 %. Body mass index is 37.95 kg/m.  Mental Status Per Nursing Assessment::   On Admission:  NA  Demographic Factors:  Male and Caucasian  Loss Factors: Uproar in his marriage  Historical Factors: NA  Risk Reduction Factors:   Sense of responsibility to family, Employed, Living with another person, especially a relative, Positive social support, and Positive coping skills or problem solving skills  Continued  Clinical Symptoms:  Severe Anxiety and/or Agitation  Cognitive Features That Contribute To Risk:  None    Suicide Risk:  Minimal: No identifiable suicidal ideation.  Patients presenting with no risk factors but with morbid ruminations; may be classified as minimal risk based on the severity of the depressive symptoms    Plan Of  Care/Follow-up recommendations:  Patient is completely denying suicidal ideation.  I spoke with his wife who feels comfortable with the plan for discharge.  No indication to start any medication at this point.  Strongly recommend couples counseling.  Case reviewed with treatment team.  Patient can be taken off IVC and discharged today.  Alethia Berthold, MD 07/21/2021, 2:19 PM

## 2021-07-21 NOTE — BHH Counselor (Signed)
CSW met with pt briefly to go through discharge procedures. Pt again DECLINED OUTPATIENT SERVICES. Pt maintains that he will be doing couples counseling through his wife's job. He states plans to return home, will arrange his own transportation, and has clothes. No other concerns expressed. Contact ended without incident.   Chalmers Guest. Guerry Bruin, MSW, [place], [place] 07/21/2021 2:41 PM

## 2021-07-21 NOTE — [facility] Counselor (Signed)
Adult Comprehensive Assessment  Patient ID: Eric Blevins, male   DOB: [birth year], 56 y.o.   MRN: [id number]  Information Source: Information source: Patient  Current Stressors:  Patient states their primary concerns and needs for treatment are:: "Stupidity... marital problems." Pt shares that he began texting another woman and his wife found out which caused and argument. He states that he made some suicidal statements but denies any intention or plans for this. Patient states their goals for this hospitilization and ongoing recovery are:: "It's already been a learned lesson." Educational / Learning stressors: None reported Employment / Job issues: None reported Family Relationships: Marital conflict due to pt talking with another woman. Financial / Lack of resources (include bankruptcy): None reported Housing / Lack of housing: None reported Physical health (include injuries & life threatening diseases): None reported Social relationships: None reported Substance abuse: Pt denies any substance use, stating "honestly, I've never even smoked a cigarette." Bereavement / Loss: None reported  Living/Environment/Situation:  Living Arrangements: Spouse/significant other Living conditions (as described by patient or guardian): "Great" Who else lives in the home?: Pt lives with his wife and states his son is back at home from college How long has patient lived in current situation?: Unable to assess What is atmosphere in current home: Comfortable, Quarry manager, Supportive  Family History:  Marital status: Married Number of Years Married: 45 What types of issues is patient dealing with in the relationship?: Pt shares that he has been entertaining another woman on his phone which caused an issue in his relationship Additional relationship information: None reported Are you sexually active?: Yes What is your sexual orientation?: Heterosexual Has your sexual activity been affected by drugs,  alcohol, medication, or emotional stress?: N/A Does patient have children?: Yes How many children?: 2 How is patient's relationship with their children?: "Good... great!"  Childhood History:  By whom was/is the patient raised?: Both parents Additional childhood history information: N/A Description of patient's relationship with caregiver when they were a child: "Great" Patient's description of current relationship with people who raised him/her: "Great" How were you disciplined when you got in trouble as a child/adolescent?: "Grounded, the normal. Never beat or anything." Does patient have siblings?: Yes Number of Siblings: 2 Description of patient's current relationship with siblings: One is great, the one that I can stay with. The other, we worry about him because we don't see him. Pt shares it's been two years since he has seen this brother and shares that brother is in active substance use. 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 Currently a student?: No Learning disability?: No  Employment/Work Situation:   Employment Situation: Employed 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 How Long has Patient Been Employed?: "Since 2000" Are You Satisfied With Your Job?: Yes Do You Work More Than One Job?: No Work Stressors: None reported Patient's Job has Been Impacted by Current Illness: No What is the Longest Time Patient has Held a Job?: This is the longest Where was the Patient Employed at that Time?: His business Has  Patient ever Been in the  Military?: No  Financial Resources:   Financial resources: Income from employment Does patient have a representative payee or guardian?: No  Alcohol/Substance Abuse:   What has been your use of drugs/alcohol within the last 12 months?: Pt denies If attempted suicide, did drugs/alcohol play a role in this?:  (N/A) Alcohol/Substance Abuse Treatment Hx: Denies past history If yes, describe treatment: N/A Has alcohol/substance abuse ever caused legal problems?: No  Social Support System:   Patient's Community Support System: Good Describe Community Support System: "My kids, friends, my wife." Type of faith/religion: Baptist How does patient's faith help to cope with current illness?: N/A  Leisure/Recreation:   Do You Have Hobbies?: Yes Leisure and Hobbies: Sport and exercise psychologist. Regular stuff.Zettie Pho." Pt shares that he is rebuilding/fixing a camaro with his son in order to give it to him as a gift in the future.  Strengths/Needs:   What is the patient's perception of their strengths?: "Fixing stuff. My wife says I'm always trying to fix everything." Patient states they can use these personal strengths during their treatment to contribute to their recovery: Pt does not address Patient states these barriers may affect/interfere with their treatment: Pt denies Patient states these barriers may affect their return to the community: Pt denies Other important information patient would like considered in planning for their treatment: Pt denies  Discharge Plan:   Currently receiving community mental health services: No Patient states concerns and preferences for aftercare planning are: He states that they (him and his wife) will be involved in couples therapy moving forward in the future. He declines interest in individual counseling and states that his wife has access to counseling services through [employer] as an employee. Patient states they will know when they are safe and ready for  discharge when: Pt expresses that he is ready for discharge today. Does patient have access to transportation?: Yes Does patient have financial barriers related to discharge medications?: No Patient description of barriers related to discharge medications: N/A Will patient be returning to same living situation after discharge?: Yes (Pt states that if he cannot return home then he will go to his brother's house, however, he shares that during visitation his wife voiced desire for him to return home upon discharge.)  Summary/Recommendations:   Summary and Recommendations (to be completed by the evaluator): Patient is a 56 year old, married, male from [place], [place] [facility] he reported he came into the hospital due to marital issues in the context of his wife finding out he was texting another woman on his cell phone. Patient shared that he made suicidal comments stating that he would shoot himself. He denies any current thoughts of suicide as well as any intention or plans previously to go through with stated plans. 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 Patient denies any history of abuse past trauma substance use or mental health history. Patient is interested in discharge today and denied any interest in outpatient follow-up stating that he would do couples counseling through his wife's job. He denies any stressors within his life other than a current marital conflict. Upon discharge, patient plans to return home with family to provide transportation. During interaction, patient was appropriate and stated his concerns without perseverating about them. Recommendations include following through with stated plans for couples counseling and individual therapy if needed.  Shirl Harris. 07/21/2021

## 2021-07-21 NOTE — [facility] Suicide Risk Assessment (Signed)
[facility] INPATIENT:  Family/Significant Other Suicide Prevention Education  Suicide Prevention Education:  Patient Refusal for Family/Significant Other Suicide Prevention Education: The patient Eric Blevins has refused to provide written consent for family/significant other to be provided Family/Significant Other Suicide Prevention Education during admission and/or prior to discharge.  Physician notified.  SPE completed with pt, as pt refused to consent to family contact. SPI pamphlet provided to pt and pt was encouraged to share information with support network, ask questions, and talk about any concerns relating to SPE. Pt denies access to guns/firearms and verbalized understanding of information provided. Mobile Crisis information also provided to pt.  Shirl Harris 07/21/2021, 2:30 PM

## 2021-07-21 NOTE — H&P (Signed)
Psychiatric Admission Assessment Adult  Patient Identification: Eric Blevins MRN:  [id number] Date of Evaluation:  07/21/2021 Chief Complaint:  Major depressive disorder, recurrent severe without psychotic features ([place]) [F33.2] Principal Diagnosis: Adjustment disorder with mixed disturbance of emotions and conduct Diagnosis:  Principal Problem:   Adjustment disorder with mixed disturbance of emotions and conduct  History of Present Illness: Patient seen and chart reviewed.  56 year old man with no past psychiatric history brought to the hospital under IVC after impulsively texting his wife that he was going to kill himself.  Patient and his wife had a confrontation over the weekend because the wife discovered that he had been texting with some other women.  Patient moved out of the house for a day and says he did not sleep for about 2 days.  Patient admits that he made the text messages but says he never intended to kill himself.  Prior to the uproar on the weekend he had not been having any symptoms of depression or anxiety at all.  Patient was not drinking or using any drugs.  Does not receive any outpatient mental health care.  Since coming into the emergency room he has met with his wife and he feels comfortable about going home and that the 2 of them are going to work on their problems.  No evidence of psychosis.  Spoke with wife who also feels safe and comfortable with his coming home.  Firearms have been removed from the house. Associated Signs/Symptoms: Depression Symptoms:  insomnia, suicidal thoughts without plan, Duration of Depression Symptoms: Greater than two weeks  (Hypo) Manic Symptoms:   None Anxiety Symptoms:  Excessive Worry, Psychotic Symptoms:   None PTSD Symptoms: Negative Total Time spent with patient: 1 hour  Past Psychiatric History: Patient has no previous psychiatric history.  No history of suicide attempts or violence.  No history of substance abuse.  No history  of any previous mental health treatment or medication.  Is the patient at risk to self? No.  Has the patient been a risk to self in the past 6 months? Yes.    Has the patient been a risk to self within the distant past? No.  Is the patient a risk to others? No.  Has the patient been a risk to others in the past 6 months? No.  Has the patient been a risk to others within the distant past? No.   Prior Inpatient Therapy:   Prior Outpatient Therapy:    Alcohol Screening: 1. How often do you have a drink containing alcohol?: Never 2. How many drinks containing alcohol do you have on a typical day when you are drinking?: 1 or 2 3. How often do you have six or more drinks on one occasion?: Never AUDIT-C Score: 0 4. How often during the last year have you found that you were not able to stop drinking once you had started?: Never 5. How often during the last year have you failed to do what was normally expected from you because of drinking?: Never 6. How often during the last year have you needed a first drink in the morning to get yourself going after a heavy drinking session?: Never 7. How often during the last year have you had a feeling of guilt of remorse after drinking?: Never 8. How often during the last year have you been unable to remember what happened the night before because you had been drinking?: Never 9. Have you or someone else been injured as a result  of your drinking?: No 10. Has a relative or friend or a doctor or another health worker been concerned about your drinking or suggested you cut down?: No Alcohol Use Disorder Identification Test Final Score (AUDIT): 0 Substance Abuse History in the last 12 months:  No. Consequences of Substance Abuse: NA Previous Psychotropic Medications: No  Psychological Evaluations: No  Past Medical History:  Past Medical History:  Diagnosis Date   Allergy    Benign hypertensive renal disease    Chronic kidney disease, stage I    ED  (erectile dysfunction)    Hyperlipidemia    Hypertension    Hypogonadism in male    Obesity    Sleep apnea    Thrombocytosis     Past Surgical History:  Procedure Laterality Date   COLONOSCOPY WITH PROPOFOL N/A 12/17/2017   Procedure: COLONOSCOPY WITH PROPOFOL;  Surgeon: Lin Landsman, MD;  Location: [facility] ENDOSCOPY;  Service: Gastroenterology;  Laterality: N/A;   LUMBAR DISC SURGERY  1990   l4/5    Family History:  Family History  Problem Relation Age of Onset   Hypertension Father    Hyperlipidemia Father    Diabetes Mother    Cancer Maternal Grandmother    Cancer Maternal Grandfather    Diabetes Paternal Grandmother    Diabetes Brother    Thyroid disease Brother    Family Psychiatric  History: Denies knowing of any Tobacco Screening:   Social History:  Social History   Substance and Sexual Activity  Alcohol Use No     Social History   Substance and Sexual Activity  Drug Use No    Additional Social History: Marital status: Married Number of Years Married: 59 What types of issues is patient dealing with in the relationship?: Pt shares that he has been entertaining another woman on his phone which caused an issue in his relationship Additional relationship information: None reported Are you sexually active?: Yes What is your sexual orientation?: Heterosexual Has your sexual activity been affected by drugs, alcohol, medication, or emotional stress?: N/A Does patient have children?: Yes How many children?: 2 How is patient's relationship with their children?: "Good... great!"                         Allergies:   Allergies  Allergen Reactions   Bee Pollen Anaphylaxis    Bee stings   Lab Results:  Results for orders placed or performed during the hospital encounter of 07/19/21 (from the past 48 hour(s))  Comprehensive metabolic panel     Status: Abnormal   Collection Time: 07/19/21  6:50 PM  Result Value Ref Range   Sodium 138 135 - 145  mmol/L   Potassium 3.4 (L) 3.5 - 5.1 mmol/L   Chloride 99 98 - 111 mmol/L   CO2 27 22 - 32 mmol/L   Glucose, Bld 109 (H) 70 - 99 mg/dL    Comment: Glucose reference range applies only to samples taken after fasting for at least 8 hours.   BUN 17 6 - 20 mg/dL   Creatinine, Ser 1.32 (H) 0.61 - 1.24 mg/dL   Calcium 9.4 8.9 - 10.3 mg/dL   Total Protein 8.2 (H) 6.5 - 8.1 g/dL   Albumin 4.3 3.5 - 5.0 g/dL   AST 28 15 - 41 U/L   ALT 28 0 - 44 U/L   Alkaline Phosphatase 77 38 - 126 U/L   Total Bilirubin 0.7 0.3 - 1.2 mg/dL   GFR, Estimated >60 >  60 mL/min    Comment: (NOTE) Calculated using the CKD-EPI Creatinine Equation (2021)    Anion gap 12 5 - 15    Comment: Performed at [facility], [address] [place], [place] [address]  Ethanol     Status: None   Collection Time: 07/19/21  6:50 PM  Result Value Ref Range   Alcohol, Ethyl (B) <10 <10 mg/dL    Comment: (NOTE) Lowest detectable limit for serum alcohol is 10 mg/dL.  For medical purposes only. Performed at [facility], [address] [place], [place] [address]   Salicylate level     Status: Abnormal   Collection Time: 07/19/21  6:50 PM  Result Value Ref Range   Salicylate Lvl <4.5 (L) 7.0 - 30.0 mg/dL    Comment: Performed at [facility], [place], [place] [address]  Acetaminophen level     Status: Abnormal   Collection Time: 07/19/21  6:50 PM  Result Value Ref Range   Acetaminophen (Tylenol), Serum <10 (L) 10 - 30 ug/mL    Comment: (NOTE) Therapeutic concentrations vary significantly. A range of 10-30 ug/mL  may be an effective concentration for many patients. However, some  are best treated at concentrations outside of this range. Acetaminophen concentrations >150 ug/mL at 4 hours after ingestion  and >50 ug/mL at 12 hours after ingestion are often associated with  toxic reactions.  Performed at [facility], [address] [place], [place] [address]   cbc      Status: Abnormal   Collection Time: 07/19/21  6:50 PM  Result Value Ref Range   WBC 8.8 4.0 - 10.5 K/uL   RBC 5.24 4.22 - 5.81 MIL/uL   Hemoglobin 15.9 13.0 - 17.0 g/dL   HCT 47.8 39.0 - 52.0 %   MCV 91.2 80.0 - 100.0 fL   MCH 30.3 26.0 - 34.0 pg   MCHC 33.3 30.0 - 36.0 g/dL   RDW 13.1 11.5 - 15.5 %   Platelets 426 (H) 150 - 400 K/uL   nRBC 0.0 0.0 - 0.2 %    Comment: Performed at [facility], [address] [place], [place] [address]  Urine Drug Screen, Qualitative     Status: None   Collection Time: 07/19/21  7:19 PM  Result Value Ref Range   Tricyclic, Ur Screen NONE DETECTED NONE DETECTED   Amphetamines, Ur Screen NONE DETECTED NONE DETECTED   MDMA (Ecstasy)Ur Screen NONE DETECTED NONE DETECTED   Cocaine Metabolite,Ur [place] NONE DETECTED NONE DETECTED   Opiate, Ur Screen NONE DETECTED NONE DETECTED   Phencyclidine (PCP) Ur S NONE DETECTED NONE DETECTED   Cannabinoid 50 Ng, Ur [place] NONE DETECTED NONE DETECTED   Barbiturates, Ur Screen NONE DETECTED NONE DETECTED   Benzodiazepine, Ur Scrn NONE DETECTED NONE DETECTED   Methadone Scn, Ur NONE DETECTED NONE DETECTED    Comment: (NOTE) Tricyclics + metabolites, urine    Cutoff 1000 ng/mL Amphetamines + metabolites, urine  Cutoff 1000 ng/mL MDMA (Ecstasy), urine              Cutoff 500 ng/mL Cocaine Metabolite, urine          Cutoff 300 ng/mL Opiate + metabolites, urine        Cutoff 300 ng/mL Phencyclidine (PCP), urine         Cutoff 25 ng/mL Cannabinoid, urine                 Cutoff 50 ng/mL Barbiturates + metabolites,  urine  Cutoff 200 ng/mL Benzodiazepine, urine              Cutoff 200 ng/mL Methadone, urine                   Cutoff 300 ng/mL  The urine drug screen provides only a preliminary, unconfirmed analytical test result and should not be used for non-medical purposes. Clinical consideration and professional judgment should be applied to any positive drug screen result due to possible interfering  substances. A more specific alternate chemical method must be used in order to obtain a confirmed analytical result. Gas chromatography / mass spectrometry (GC/MS) is the preferred confirm atory method. Performed at [facility], [address] [place], [place] [address]   Resp Panel by RT-PCR (Flu A&B, Covid) Anterior Nasal Swab     Status: None   Collection Time: 07/19/21  9:21 PM   Specimen: Anterior Nasal Swab  Result Value Ref Range   SARS Coronavirus 2 by RT PCR NEGATIVE NEGATIVE    Comment: (NOTE) SARS-CoV-2 target nucleic acids are NOT DETECTED.  The SARS-CoV-2 RNA is generally detectable in upper respiratory specimens during the acute phase of infection. The lowest concentration of SARS-CoV-2 viral copies this assay can detect is 138 copies/mL. A negative result does not preclude SARS-Cov-2 infection and should not be used as the sole basis for treatment or other patient management decisions. A negative result may occur with  improper specimen collection/handling, submission of specimen other than nasopharyngeal swab, presence of viral mutation(s) within the areas targeted by this assay, and inadequate number of viral copies(<138 copies/mL). A negative result must be combined with clinical observations, patient history, and epidemiological information. The expected result is Negative.  Fact Sheet for Patients:  EntrepreneurPulse.com.au  Fact Sheet for Healthcare Providers:  IncredibleEmployment.be  This test is no t yet approved or cleared by the [place] FDA and  has been authorized for detection and/or diagnosis of SARS-CoV-2 by FDA under an Emergency Use Authorization (EUA). This EUA will remain  in effect (meaning this test can be used) for the duration of the COVID-19 declaration under Section 564(b)(1) of the Act, 21 U.S.C.section 360bbb-3(b)(1), unless the authorization is terminated  or revoked sooner.        Influenza A by PCR NEGATIVE NEGATIVE   Influenza B by PCR NEGATIVE NEGATIVE    Comment: (NOTE) The Xpert Xpress SARS-CoV-2/FLU/RSV plus assay is intended as an aid in the diagnosis of influenza from Nasopharyngeal swab specimens and should not be used as a sole basis for treatment. Nasal washings and aspirates are unacceptable for Xpert Xpress SARS-CoV-2/FLU/RSV testing.  Fact Sheet for Patients: EntrepreneurPulse.com.au  Fact Sheet for Healthcare Providers: IncredibleEmployment.be  This test is not yet approved or cleared by the [place] FDA and has been authorized for detection and/or diagnosis of SARS-CoV-2 by FDA under an Emergency Use Authorization (EUA). This EUA will remain in effect (meaning this test can be used) for the duration of the COVID-19 declaration under Section 564(b)(1) of the Act, 21 U.S.C. section 360bbb-3(b)(1), unless the authorization is terminated or revoked.  Performed at [facility], [address] [place], [place] [address]     Blood Alcohol level:  Lab Results  Component Value Date   [facility] <10 09/81/1914    Metabolic Disorder Labs:  Lab Results  Component Value Date   HGBA1C 5.9 (H) 09/28/2016   No results found for: PROLACTIN Lab Results  Component Value Date   CHOL 260 (  H) 07/29/2020   TRIG 110 07/29/2020   HDL 52 07/29/2020   CHOLHDL 5 05/11/2010   VLDL 25 09/28/2015   LDLCALC 188 (H) 07/29/2020   LDLCALC 183 (H) 01/23/2020    Current Medications: Current Facility-Administered Medications  Medication Dose Route Frequency Provider Last Rate Last Admin   acetaminophen (TYLENOL) tablet 650 mg  650 mg Oral Q6H PRN Patrecia Pour, NP       albuterol (VENTOLIN HFA) 108 (90 Base) MCG/ACT inhaler 2 puff  2 puff Inhalation Q4H PRN Patrecia Pour, NP       alum & mag hydroxide-simeth (MAALOX/MYLANTA) 200-200-20 MG/5ML suspension 30 mL  30 mL Oral Q4H PRN Patrecia Pour, NP        lisinopril (ZESTRIL) tablet 5 mg  5 mg Oral Daily Patrecia Pour, NP   5 mg at 07/21/21 0753   loratadine (CLARITIN) tablet 10 mg  10 mg Oral Daily Patrecia Pour, NP   10 mg at 07/21/21 0753   magnesium hydroxide (MILK OF MAGNESIA) suspension 30 mL  30 mL Oral Daily PRN Patrecia Pour, NP       mometasone-formoterol (DULERA) 200-5 MCG/ACT inhaler 2 puff  2 puff Inhalation BID Patrecia Pour, NP   2 puff at 07/21/21 0758   rosuvastatin (CRESTOR) tablet 10 mg  10 mg Oral Daily Patrecia Pour, NP   10 mg at 07/21/21 0753   sertraline (ZOLOFT) tablet 100 mg  100 mg Oral Daily Patrecia Pour, NP   100 mg at 07/21/21 4098   PTA Medications: Medications Prior to Admission  Medication Sig Dispense Refill Last Dose   albuterol (VENTOLIN HFA) 108 (90 Base) MCG/ACT inhaler Inhale 2 puffs into the lungs every 4 (four) hours as needed.      amLODipine (NORVASC) 10 MG tablet Take 10 mg by mouth daily.      amLODipine (NORVASC) 5 MG tablet TAKE 1 TABLET (5 MG TOTAL) BY MOUTH DAILY. (Patient not taking: Reported on 07/20/2021) 30 tablet 0    diclofenac Sodium (VOLTAREN) 1 % GEL Apply 4 g topically 4 (four) times daily. (Patient not taking: Reported on 07/20/2021) 100 g 12    ibuprofen (ADVIL) 600 MG tablet Take 1 tablet (600 mg total) by mouth every 6 (six) hours as needed. (Patient not taking: Reported on 07/20/2021) 20 tablet 0    lisinopril (ZESTRIL) 2.5 MG tablet TAKE 1 TABLET BY MOUTH EVERY DAY (Patient not taking: Reported on 07/20/2021) 90 tablet 0    lisinopril (ZESTRIL) 5 MG tablet Take 5 mg by mouth daily.      loratadine (CLARITIN) 10 MG tablet Take 10 mg by mouth daily.      olmesartan-hydrochlorothiazide (BENICAR HCT) 40-12.5 MG tablet Take 1 tablet by mouth daily.      ondansetron (ZOFRAN ODT) 4 MG disintegrating tablet Take 1 tablet (4 mg total) by mouth every 8 (eight) hours as needed. (Patient not taking: Reported on 07/20/2021) 20 tablet 0    phentermine (ADIPEX-P) 37.5 MG tablet Take 37.5 mg  by mouth every morning.      rosuvastatin (CRESTOR) 10 MG tablet Take 10 mg by mouth daily.      sertraline (ZOLOFT) 100 MG tablet TAKE 1 TABLET BY MOUTH EVERY DAY (Patient taking differently: Take 100 mg by mouth daily.) 90 tablet 1    SYMBICORT 160-4.5 MCG/ACT inhaler Inhale 2 puffs into the lungs in the morning and at bedtime.      testosterone (ANDROGEL) 50 MG/5GM (  1%) GEL PLACE 10 G ONTO THE SKIN DAILY. 300 g 5    testosterone cypionate (DEPOTESTOSTERONE CYPIONATE) 200 MG/ML injection Inject 200 mg into the muscle every 14 (fourteen) days.       Musculoskeletal: Strength & Muscle Tone: within normal limits Gait & Station: normal Patient leans: N/A            Psychiatric Specialty Exam:  Presentation  General Appearance: Appropriate for Environment  Eye Contact:Good  Speech:Clear and Coherent  Speech Volume:Normal  Handedness:Right   Mood and Affect  Mood:Anxious  Affect:Depressed   Thought Process  Thought Processes:Coherent  Duration of Psychotic Symptoms: No data recorded Past Diagnosis of Schizophrenia or Psychoactive disorder: No  Descriptions of Associations:Intact  Orientation:Full (Time, Place and Person)  Thought Content:Logical  Hallucinations:No data recorded Ideas of Reference:None  Suicidal Thoughts:No data recorded Homicidal Thoughts:No data recorded  Sensorium  Memory:Immediate Good; Remote Good; Recent Good  Judgment:Fair  Insight:Fair   Executive Functions  Concentration:Good  Attention Span:Good  [place]   Psychomotor Activity  Psychomotor Activity:No data recorded  Assets  Assets:Communication Skills; Desire for Improvement; Social Support; Physical Health   Sleep  Sleep:No data recorded   Physical Exam: Physical Exam Vitals and nursing note reviewed.  Constitutional:      Appearance: Normal appearance.  HENT:     Head: Normocephalic and atraumatic.      Mouth/Throat:     Pharynx: Oropharynx is clear.  Eyes:     Pupils: Pupils are equal, round, and reactive to light.  Cardiovascular:     Rate and Rhythm: Normal rate and regular rhythm.  Pulmonary:     Effort: Pulmonary effort is normal.     Breath sounds: Normal breath sounds.  Abdominal:     General: Abdomen is flat.     Palpations: Abdomen is soft.  Musculoskeletal:        General: Normal range of motion.  Skin:    General: Skin is warm and dry.  Neurological:     General: No focal deficit present.     Mental Status: He is alert. Mental status is at baseline.  Psychiatric:        Attention and Perception: Attention normal.        Mood and Affect: Mood normal.        Speech: Speech normal.        Behavior: Behavior normal.        Thought Content: Thought content normal.        Cognition and Memory: Cognition normal.        Judgment: Judgment normal.   Review of Systems  Constitutional: Negative.   HENT: Negative.    Eyes: Negative.   Respiratory: Negative.    Cardiovascular: Negative.   Gastrointestinal: Negative.   Musculoskeletal: Negative.   Skin: Negative.   Neurological: Negative.   Psychiatric/Behavioral:  Negative for depression, hallucinations, substance abuse and suicidal ideas. The patient is nervous/anxious and has insomnia.   Blood pressure 110/81, pulse 92, temperature 97.8 F (36.6 C), temperature source Oral, resp. rate 19, height 5' 9"  (1.753 m), weight 116.6 kg, SpO2 97 %. Body mass index is 37.95 kg/m.  Treatment Plan Summary: Plan patient has consistently denied suicidal ideation.  He appears to be genuinely contrite and also focused on finding positive solutions.  He and his wife both say they are agreeable to getting involved with couples counseling.  No evidence of psychosis.  No past psychiatric history.  No substance abuse.  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  After speaking  with wife and finding that firearms have been removed from the house patient will be discharged home.  No clear indication for any medication.  Observation Level/Precautions:  15 minute checks  Laboratory:  Chemistry Profile  Psychotherapy:    Medications:    Consultations:    Discharge Concerns:    Estimated LOS:  Other:     Physician Treatment Plan for Primary Diagnosis: Adjustment disorder with mixed disturbance of emotions and conduct Long Term Goal(s): Improvement in symptoms so as ready for discharge  Short Term Goals: Ability to verbalize feelings will improve and Ability to disclose and discuss suicidal ideas  Physician Treatment Plan for Secondary Diagnosis: Principal Problem:   Adjustment disorder with mixed disturbance of emotions and conduct  Long Term Goal(s): Improvement in symptoms so as ready for discharge  Short Term Goals: Ability to maintain clinical measurements within normal limits will improve  I certify that inpatient services furnished can reasonably be expected to improve the patient's condition.    Alethia Berthold, MD 6/1/20232:21 PM

## 2021-07-21 NOTE — Progress Notes (Signed)
  [facility] Adult Case Management Discharge Plan :  Will you be returning to the same living situation after discharge:  Yes,  pt to return home. At discharge, do you have transportation home?: Yes,  pt family to provide transport. Do you have the ability to pay for your medications: Yes,  [employer].  Release of information consent forms completed and in the chart;  Patient's signature needed at discharge.  Patient to Follow up at:  Follow-up Information     [facility] Follow up.   Why: Although you declined outpatient services, [facility] provides walk-in access Monday, Wednesday, and Friday from 8:30am until 4pm if needed. Thanks! Contact information: [place] [address] [phone]                 Next level of care provider has access to [place] and Suicide Prevention discussed: Yes,  SPE completed with the pt.      Has patient been referred to the Quitline?: N/A patient is not a smoker  Patient has been referred for addiction treatment: [place], LCSW 07/21/2021, 3:28 PM

## 2021-07-21 NOTE — BHH Suicide Risk Assessment (Signed)
[facility] Admission Suicide Risk Assessment   Nursing information obtained from:  Patient Demographic factors:  Male, Caucasian, Access to firearms Current Mental Status:  NA Loss Factors:  Loss of significant relationship Historical Factors:  NA Risk Reduction Factors:  Sense of responsibility to family, Living with another person, especially a relative  Total Time spent with patient: 1 hour Principal Problem: Adjustment disorder with mixed disturbance of emotions and conduct Diagnosis:  Principal Problem:   Adjustment disorder with mixed disturbance of emotions and conduct  Subjective Data: Patient seen and chart reviewed.  56 year old man brought to the hospital under IVC after making suicidal statements to his wife.  Since coming into the emergency room he has denied any suicidal ideation and on interview today absolutely denies any suicidal thought intent or plan.  Describes a clear forward-looking plan for the future.  No evidence of psychosis.  No substance abuse.  Agrees to outpatient treatment.  Continued Clinical Symptoms:  Alcohol Use Disorder Identification Test Final Score (AUDIT): 0 The "Alcohol Use Disorders Identification Test", Guidelines for Use in Primary Care, Second Edition.  World Pharmacologist [facility]). Score between 0-7:  no or low risk or alcohol related problems. Score between 8-15:  moderate risk of alcohol related problems. Score between 16-19:  high risk of alcohol related problems. Score 20 or above:  warrants further diagnostic evaluation for alcohol dependence and treatment.   CLINICAL FACTORS:   Severe Anxiety and/or Agitation   Musculoskeletal: Strength & Muscle Tone: within normal limits Gait & Station: normal Patient leans: N/A  Psychiatric Specialty Exam:  Presentation  General Appearance: Appropriate for Environment  Eye Contact:Good  Speech:Clear and Coherent  Speech Volume:Normal  Handedness:Right   Mood and Affect   Mood:Anxious  Affect:Depressed   Thought Process  Thought Processes:Coherent  Descriptions of Associations:Intact  Orientation:Full (Time, Place and Person)  Thought Content:Logical  History of Schizophrenia/Schizoaffective disorder:No  Duration of Psychotic Symptoms:No data recorded Hallucinations:No data recorded Ideas of Reference:None  Suicidal Thoughts:No data recorded Homicidal Thoughts:No data recorded  Sensorium  Memory:Immediate Good; Remote Good; Recent Good  Judgment:Fair  Insight:Fair   Executive Functions  Concentration:Good  Attention Span:Good  [place]   Psychomotor Activity  Psychomotor Activity:No data recorded  Assets  Assets:Communication Skills; Desire for Improvement; Social Support; Physical Health   Sleep  Sleep:No data recorded   Physical Exam: Physical Exam Vitals and nursing note reviewed.  Constitutional:      Appearance: Normal appearance.  HENT:     Head: Normocephalic and atraumatic.     Mouth/Throat:     Pharynx: Oropharynx is clear.  Eyes:     Pupils: Pupils are equal, round, and reactive to light.  Cardiovascular:     Rate and Rhythm: Normal rate and regular rhythm.  Pulmonary:     Effort: Pulmonary effort is normal.     Breath sounds: Normal breath sounds.  Abdominal:     General: Abdomen is flat.     Palpations: Abdomen is soft.  Musculoskeletal:        General: Normal range of motion.  Skin:    General: Skin is warm and dry.  Neurological:     General: No focal deficit present.     Mental Status: He is alert. Mental status is at baseline.  Psychiatric:        Attention and Perception: Attention normal.        Mood and Affect: Mood normal.        Speech: Speech  normal.        Behavior: Behavior normal.        Thought Content: Thought content normal.        Cognition and Memory: Cognition normal.        Judgment: Judgment normal.   Review of Systems   Constitutional: Negative.   HENT: Negative.    Eyes: Negative.   Respiratory: Negative.    Cardiovascular: Negative.   Gastrointestinal: Negative.   Musculoskeletal: Negative.   Skin: Negative.   Neurological: Negative.   Psychiatric/Behavioral:  Negative for depression, hallucinations, memory loss, substance abuse and suicidal ideas. The patient is nervous/anxious and has insomnia.   Blood pressure 110/81, pulse 92, temperature 97.8 F (36.6 C), temperature source Oral, resp. rate 19, height '5\' 9"'$  (1.753 m), weight 116.6 kg, SpO2 97 %. Body mass index is 37.95 kg/m.   COGNITIVE FEATURES THAT CONTRIBUTE TO RISK:  None    SUICIDE RISK:   Minimal: No identifiable suicidal ideation.  Patients presenting with no risk factors but with morbid ruminations; may be classified as minimal risk based on the severity of the depressive symptoms  PLAN OF CARE: Patient will be discharged home with recommendation to follow up with couples counseling.  No indication for any medication.  Case reviewed with treatment team.  Does not appear to be at elevated risk of dangerousness.  I certify that inpatient services furnished can reasonably be expected to improve the patient's condition.   Alethia Berthold, MD 07/21/2021, 2:17 PM

## 2021-07-21 NOTE — BHH Counselor (Signed)
CSW met with pt regarding completion of the PSA. During interaction, pt DECLINED interest in scheduled outpatient appointment. He stated that his wife works for [employer] and that they would be attending couples counseling through her job in the future. No other concerns expressed. Contact ended without incident.   Chalmers Guest. Guerry Bruin, MSW, [place], [place] 07/21/2021 2:27 PM

## 2021-07-21 NOTE — Plan of Care (Signed)
D: Pt alert and oriented. Pt rates depression 6/10, hopelessness 0/10, and anxiety 8/10. Pt reports energy level as normal and concentration as being good. Pt reports sleep last night as being good. Pt did not receive medications for sleep. Pt denies experiencing any pain at this time. Pt denies experiencing any SI/HI, or AVH at this time.   A: Scheduled medications administered to pt, per MD orders. Support and encouragement provided. Frequent verbal contact made. Routine safety checks conducted q15 minutes.   R: No adverse drug reactions noted. Pt verbally contracts for safety at this time. Pt compliant with medications. Pt interacts minimally with others on the unit. Pt remains safe at this time. Will continue to monitor.   Problem: Education: Goal: Knowledge of [place] [facility] Education information/materials will improve Outcome: Progressing   Problem: Education: Goal: Emotional status will improve Outcome: Not Progressing

## 2021-07-21 NOTE — Discharge Summary (Signed)
Physician Discharge Summary Note  Patient:  Eric Blevins is an 56 y.o., male MRN:  [id number] DOB:  1966-02-02 Patient phone:  [phone] (home)  Patient address:   [place] [address],  Total Time spent with patient: 1 hour  Date of Admission:  07/20/2021 Date of Discharge: 07/21/2021  Reason for Admission: Patient was admitted after texting suicidal statements to his wife.  Principal Problem: Adjustment disorder with mixed disturbance of emotions and conduct Discharge Diagnoses: Principal Problem:   Adjustment disorder with mixed disturbance of emotions and conduct   Past Psychiatric History: No history of past psychiatric treatment or illness  Past Medical History:  Past Medical History:  Diagnosis Date   Allergy    Benign hypertensive renal disease    Chronic kidney disease, stage I    ED (erectile dysfunction)    Hyperlipidemia    Hypertension    Hypogonadism in male    Obesity    Sleep apnea    Thrombocytosis     Past Surgical History:  Procedure Laterality Date   COLONOSCOPY WITH PROPOFOL N/A 12/17/2017   Procedure: COLONOSCOPY WITH PROPOFOL;  Surgeon: Lin Landsman, MD;  Location: [facility] ENDOSCOPY;  Service: Gastroenterology;  Laterality: N/A;   LUMBAR DISC SURGERY  1990   l4/5    Family History:  Family History  Problem Relation Age of Onset   Hypertension Father    Hyperlipidemia Father    Diabetes Mother    Cancer Maternal Grandmother    Cancer Maternal Grandfather    Diabetes Paternal Grandmother    Diabetes Brother    Thyroid disease Brother    Family Psychiatric  History: None reported Social History:  Social History   Substance and Sexual Activity  Alcohol Use No     Social History   Substance and Sexual Activity  Drug Use No    Social History   Socioeconomic History   Marital status: Married    Spouse name: Not on file   Number of children: Not on file   Years of education: Not on file   Highest education  level: Not on file  Occupational History   Not on file  Tobacco Use   Smoking status: Never   Smokeless tobacco: Never  Vaping Use   Vaping Use: Never used  Substance and Sexual Activity   Alcohol use: No   Drug use: No   Sexual activity: Yes    Birth control/protection: Condom  Other Topics Concern   Not on file  Social History Narrative   Not on file   Social Determinants of Health   Financial Resource Strain: Not on file  Food Insecurity: Not on file  Transportation Needs: Not on file  Physical Activity: Not on file  Stress: Not on file  Social Connections: Not on file    Hospital Course: Admitted to psychiatric unit.  15-minute checks continued.  Patient showed no dangerous behavior in the hospital.  Consistently denied suicidal ideation.  He was cooperative with procedure.  On interview he is lucid and insightful.  He and his wife agree that they feel safe and comfortable with him coming home.  No indication for any medication as this seems to be a reaction to a specific stress.  Recommend follow up with marriage counseling  Physical Findings: AIMS:  , ,  ,  ,    CIWA:    COWS:     Musculoskeletal: Strength & Muscle Tone: within normal limits Gait & Station: normal Patient leans:  N/A   Psychiatric Specialty Exam:  Presentation  General Appearance: Appropriate for Environment  Eye Contact:Good  Speech:Clear and Coherent  Speech Volume:Normal  Handedness:Right   Mood and Affect  Mood:Anxious  Affect:Depressed   Thought Process  Thought Processes:Coherent  Descriptions of Associations:Intact  Orientation:Full (Time, Place and Person)  Thought Content:Logical  History of Schizophrenia/Schizoaffective disorder:No  Duration of Psychotic Symptoms:No data recorded Hallucinations:No data recorded Ideas of Reference:None  Suicidal Thoughts:No data recorded Homicidal Thoughts:No data recorded  Sensorium  Memory:Immediate Good; Remote Good;  Recent Good  Judgment:Fair  Insight:Fair   Executive Functions  Concentration:Good  Attention Span:Good  [place]   Psychomotor Activity  Psychomotor Activity:No data recorded  Assets  Assets:Communication Skills; Desire for Improvement; Social Support; Physical Health   Sleep  Sleep:No data recorded   Physical Exam: Physical Exam Vitals and nursing note reviewed.  Constitutional:      Appearance: Normal appearance.  HENT:     Head: Normocephalic and atraumatic.     Mouth/Throat:     Pharynx: Oropharynx is clear.  Eyes:     Pupils: Pupils are equal, round, and reactive to light.  Cardiovascular:     Rate and Rhythm: Normal rate and regular rhythm.  Pulmonary:     Effort: Pulmonary effort is normal.     Breath sounds: Normal breath sounds.  Abdominal:     General: Abdomen is flat.     Palpations: Abdomen is soft.  Musculoskeletal:        General: Normal range of motion.  Skin:    General: Skin is warm and dry.  Neurological:     General: No focal deficit present.     Mental Status: He is alert. Mental status is at baseline.  Psychiatric:        Attention and Perception: Attention normal.        Mood and Affect: Mood normal.        Speech: Speech normal.        Behavior: Behavior normal.        Thought Content: Thought content normal.        Cognition and Memory: Cognition normal.        Judgment: Judgment normal.   Review of Systems  Constitutional: Negative.   HENT: Negative.    Eyes: Negative.   Respiratory: Negative.    Cardiovascular: Negative.   Gastrointestinal: Negative.   Musculoskeletal: Negative.   Skin: Negative.   Neurological: Negative.   Psychiatric/Behavioral:  Negative for depression, hallucinations, substance abuse and suicidal ideas. The patient is nervous/anxious and has insomnia.   Blood pressure 110/81, pulse 92, temperature 97.8 F (36.6 C), temperature source Oral, resp. rate  19, height '5\' 9"'$  (1.753 m), weight 116.6 kg, SpO2 97 %. Body mass index is 37.95 kg/m.   Social History   Tobacco Use  Smoking Status Never  Smokeless Tobacco Never   Tobacco Cessation:  N/A, patient does not currently use tobacco products   Blood Alcohol level:  Lab Results  Component Value Date   ETH <10 46/27/0350    Metabolic Disorder Labs:  Lab Results  Component Value Date   HGBA1C 5.9 (H) 09/28/2016   No results found for: PROLACTIN Lab Results  Component Value Date   CHOL 260 (H) 07/29/2020   TRIG 110 07/29/2020   HDL 52 07/29/2020   CHOLHDL 5 05/11/2010   VLDL 25 09/28/2015   LDLCALC 188 (H) 07/29/2020   LDLCALC 183 (H) 01/23/2020    See  Psychiatric Specialty Exam and Suicide Risk Assessment completed by Attending Physician prior to discharge.  Discharge destination:  Home  Is patient on multiple antipsychotic therapies at discharge:  No   Has Patient had three or more failed trials of antipsychotic monotherapy by history:  No  Recommended Plan for Multiple Antipsychotic Therapies: NA  Discharge Instructions     Diet - low sodium heart healthy   Complete by: As directed    Increase activity slowly   Complete by: As directed       Allergies as of 07/21/2021       Reactions   Bee Pollen Anaphylaxis   Bee stings        Medication List     STOP taking these medications    albuterol 108 (90 Base) MCG/ACT inhaler Commonly known as: VENTOLIN HFA   diclofenac Sodium 1 % Gel Commonly known as: Voltaren   ibuprofen 600 MG tablet Commonly known as: ADVIL   olmesartan-hydrochlorothiazide 40-12.5 MG tablet Commonly known as: BENICAR HCT   ondansetron 4 MG disintegrating tablet Commonly known as: Zofran ODT   phentermine 37.5 MG tablet Commonly known as: ADIPEX-P   sertraline 100 MG tablet Commonly known as: ZOLOFT   Symbicort 160-4.5 MCG/ACT inhaler Generic drug: budesonide-formoterol   testosterone 50 MG/5GM (1%) Gel Commonly  known as: ANDROGEL   testosterone cypionate 200 MG/ML injection Commonly known as: DEPOTESTOSTERONE CYPIONATE       TAKE these medications      Indication  amLODipine 10 MG tablet Commonly known as: NORVASC Take 10 mg by mouth daily. What changed: Another medication with the same name was removed. Continue taking this medication, and follow the directions you see here.  Indication: High Blood Pressure Disorder   lisinopril 5 MG tablet Commonly known as: ZESTRIL Take 5 mg by mouth daily. What changed: Another medication with the same name was removed. Continue taking this medication, and follow the directions you see here.  Indication: High Blood Pressure Disorder   loratadine 10 MG tablet Commonly known as: CLARITIN Take 10 mg by mouth daily.  Indication: Hayfever   rosuvastatin 10 MG tablet Commonly known as: CRESTOR Take 10 mg by mouth daily.  Indication: High Amount of Fats in the Blood         Follow-up recommendations: Recommend outpatient therapy  Comments: No medications  Signed: Alethia Berthold, MD 07/21/2021, 2:28 PM

## 2021-07-21 NOTE — Progress Notes (Signed)
D: Pt alert and oriented. Pt denies experiencing any pain, SI/HI, or AVH at this time. Pt reports he will be able to keep himself safe when they return home. Pt has completed a suicide safety plan and was given a survey to fill out.  A: Pt received discharge and medication education/information. Pt belongings were returned and signed for at this time.   R: Pt verbalized understanding of discharge and medication education/information.  Pt escorted by staff to medical mall front lobby where pt was picked by wife.

## 2021-07-22 NOTE — Group Note (Signed)
[facility] LCSW Group Therapy Note   Group Date: 07/21/2021 Start Time: 1300 End Time: 1400   Type of Therapy/Topic:  Group Therapy:  Balance in Life  Participation Level:  Did Not Attend   Description of Group:    This group will address the concept of balance and how it feels and looks when one is unbalanced. Patients will be encouraged to process areas in their lives that are out of balance, and identify reasons for remaining unbalanced. Facilitators will guide patients utilizing problem- solving interventions to address and correct the stressor making their life unbalanced. Understanding and applying boundaries will be explored and addressed for obtaining  and maintaining a balanced life. Patients will be encouraged to explore ways to assertively make their unbalanced needs known to significant others in their lives, using other group members and facilitator for support and feedback.  Therapeutic Goals: Patient will identify two or more emotions or situations they have that consume much of in their lives. Patient will identify signs/triggers that life has become out of balance:  Patient will identify two ways to set boundaries in order to achieve balance in their lives:  Patient will demonstrate ability to communicate their needs through discussion and/or role plays  Summary of Patient Progress: X   Therapeutic Modalities:   Cognitive Behavioral Therapy Solution-Focused Therapy Assertiveness Training   Shirl Harris, LCSW

## 2021-10-19 ENCOUNTER — Other Ambulatory Visit: Payer: Self-pay | Admitting: Family Medicine

## 2021-10-19 NOTE — Telephone Encounter (Signed)
per chart as of 07/20/21 pt no longer pt at [facility].P.

## 2021-10-23 ENCOUNTER — Other Ambulatory Visit: Payer: Self-pay | Admitting: Family Medicine

## 2021-10-25 NOTE — Telephone Encounter (Signed)
Requested medications are due for refill today.  unsure  Requested medications are on the active medications list.  10 mg is as a historical med  Last refill. '10mg'$  refilled 04/28/2021 - 5 mg discontinued 07/21/2021  Future visit scheduled.   No  Notes to clinic.  [place] listed as PCP.     Requested Prescriptions  Pending Prescriptions Disp Refills   amLODipine (NORVASC) 5 MG tablet [Pharmacy Med Name: AMLODIPINE BESYLATE 5 MG TAB] 30 tablet 0    Sig: TAKE 1 TABLET (5 MG TOTAL) BY MOUTH DAILY.     Cardiovascular: Calcium Channel Blockers 2 Failed - 10/23/2021  9:57 AM      Failed - Valid encounter within last 6 months    Recent Outpatient Visits           1 year ago Routine general medical examination at a health care facility   [facility], [place], DO   1 year ago [place], Megan P, DO   1 year ago Depression, major, single episode, moderate ([place])   [place], [place], DO   2 years ago Routine general medical examination at a health care facility   [facility], [place], DO   2 years ago Benign hypertensive renal disease   [facility] [place], Megan P, DO              Passed - Last BP in normal range    BP Readings from Last 1 Encounters:  07/20/21 125/86         Passed - Last Heart Rate in normal range    Pulse Readings from Last 1 Encounters:  07/20/21 88

## 2021-12-21 ENCOUNTER — Other Ambulatory Visit: Payer: Self-pay | Admitting: Family Medicine

## 2021-12-22 NOTE — Telephone Encounter (Signed)
Unable to refill per protocol, Rx expired. Medication was discontinued 07/21/21. Will refuse.   Requested Prescriptions  Pending Prescriptions Disp Refills   amLODipine (NORVASC) 5 MG tablet [Pharmacy Med Name: AMLODIPINE BESYLATE 5 MG TAB] 30 tablet 0    Sig: TAKE 1 TABLET (5 MG TOTAL) BY MOUTH DAILY.     Cardiovascular: Calcium Channel Blockers 2 Failed - 12/21/2021  9:28 PM      Failed - Valid encounter within last 6 months    Recent Outpatient Visits           1 year ago Routine general medical examination at a health care facility   [facility], [place], DO   1 year ago [place], Megan P, DO   1 year ago Depression, major, single episode, moderate ([place])   [place], [place], DO   2 years ago Routine general medical examination at a health care facility   [facility], [place], DO   2 years ago Benign hypertensive renal disease   [facility] Johnson, Megan P, DO              Passed - Last BP in normal range    BP Readings from Last 1 Encounters:  07/20/21 125/86         Passed - Last Heart Rate in normal range    Pulse Readings from Last 1 Encounters:  07/20/21 88

## 2022-04-27 ENCOUNTER — Other Ambulatory Visit: Payer: Self-pay | Admitting: Family Medicine

## 2022-04-27 NOTE — Telephone Encounter (Signed)
Requested by interface surescripts. Provider not at this practice Requested Prescriptions  Refused Prescriptions Disp Refills   sertraline (ZOLOFT) 100 MG tablet [Pharmacy Med Name: SERTRALINE HCL 100 MG TABLET] 90 tablet 1    Sig: TAKE 1 TABLET BY MOUTH EVERY DAY     Psychiatry:  Antidepressants - SSRI - sertraline Failed - 04/27/2022  1:55 AM      Failed - Completed PHQ-2 or PHQ-9 in the last 360 days      Failed - Valid encounter within last 6 months    Recent Outpatient Visits           1 year ago Routine general medical examination at a health care facility   [facility], [place], DO   2 years ago [place], DO   2 years ago Depression, major, single episode, moderate [facility])   [place], [place], DO   2 years ago Routine general medical examination at a health care facility   [place], [place], DO   3 years ago Benign hypertensive renal disease   [place], Megan P, DO              Passed - AST in normal range and within 360 days    AST  Date Value Ref Range Status  07/19/2021 28 15 - 41 U/L Final         Passed - ALT in normal range and within 360 days    ALT  Date Value Ref Range Status  07/19/2021 28 0 - 44 U/L Final

## 2023-06-18 ENCOUNTER — Encounter: Payer: Self-pay | Admitting: Surgery

## 2023-06-18 ENCOUNTER — Ambulatory Visit: Payer: PRIVATE HEALTH INSURANCE | Admitting: Surgery

## 2023-06-18 VITALS — BP 122/74 | HR 94 | Temp 98.3°F | Ht 68.0 in | Wt 271.0 lb

## 2023-06-18 DIAGNOSIS — K429 Umbilical hernia without obstruction or gangrene: Secondary | ICD-10-CM

## 2023-06-18 DIAGNOSIS — Z6841 Body Mass Index (BMI) 40.0 and over, adult: Secondary | ICD-10-CM

## 2023-06-18 DIAGNOSIS — M6208 Separation of muscle (nontraumatic), other site: Secondary | ICD-10-CM

## 2023-06-18 NOTE — Patient Instructions (Addendum)
 A referral has been placed to [facility]. They will call you for an appointment.   Umbilical Hernia, Adult  A hernia is a lump of tissue that pushes through an opening in the muscles. An umbilical hernia happens in the belly, near the belly button. The hernia may contain tissues from the small or large intestine. It may also have fatty tissue that covers the intestines. Umbilical hernias in adults may get worse over time. They need to be treated with surgery. There are several types of umbilical hernias. They include: Indirect hernia. This occurs just above or below the belly button. It's the most common type of umbilical hernia in adults. Direct hernia. This type occurs in an opening that's formed by the belly button. Reducible hernia. This hernia comes and goes. You may see it only when you strain, cough, or lift something heavy. This type of hernia can be pushed back into the belly (reduced). Incarcerated hernia. This traps the hernia in the wall of the belly. This type of hernia can't be pushed back into the belly. It can cause a strangulated hernia. Strangulated hernia. This hernia cuts off blood flow to the tissues inside the hernia. The tissues can die if this happens. This type of hernia must be treated right away. What are the causes? An umbilical hernia happens when tissue inside the belly pushes through an opening in the muscles of the belly. What increases the risk? You're more likely to get this hernia if: You strain while lifting or pushing heavy objects. You've had several pregnancies. You have a condition that puts pressure on your belly, and you've had it for a long time. These include: Obesity. A buildup of fluid inside your belly. Vomiting or coughing all the time. Trouble pooping (constipation). You've had surgery that weakened the muscles in the belly. What are the signs or symptoms? The main symptom of this condition is a bulge at the belly button or near it. The  bulge does not cause pain. Other symptoms depend on the type of hernia you have. A reducible hernia may be seen only when you strain, cough, or lift something heavy. Other symptoms may include: Dull pain. A feeling of pressure. An incarcerated hernia may cause very bad pain. Also, you may: Vomit or feel like you may vomit. Not be able to pass gas. A strangulated hernia may cause: Pain that gets worse and worse. Vomiting, or feeling like you may vomit. Pain when you press on the hernia. Change of color on the skin over the hernia. The skin may become red or purple. Trouble pooping. Blood in the poop. How is this diagnosed? This condition may be diagnosed based on: Your symptoms and medical history. A physical exam. You may be asked to cough or strain while standing. These actions will put pressure inside your belly. The pressure can force the hernia through the opening in your muscles. Your health care provider may try to push the hernia back into your belly (reduce). How is this treated? Surgery is the only treatment for an umbilical hernia. Surgery for a strangulated hernia must be done right away. If you have a small hernia that's not incarcerated, you may need to lose weight before the surgery is done. Follow these instructions at home: Managing constipation You may need to take these actions to prevent trouble pooping. This will help to prevent straining. Drink enough fluid to keep your pee (urine) pale yellow. Take over-the-counter or prescription medicines. Eat foods that are high in  fiber, such as beans, whole grains, and fresh fruits and vegetables. Limit foods that are high in fat and sugars, such as fried or sweet foods. General instructions Do not try to push the hernia back in. Lose weight, if told by your provider. Watch your hernia for any changes in color or size. Tell your provider if any changes occur. You may need to avoid activities that put pressure on your  hernia. You may have to avoid lifting. Ask your provider how much you can safely lift. Take over-the-counter and prescription medicines only as told by your provider. Contact a health care provider if: Your hernia gets larger or feels hard. Your hernia becomes painful. You get a fever or chills. Get help right away if: You get very bad pain near the area of the hernia, and the pain comes on suddenly. You have pain and you vomit or feel like you may vomit. The skin over your hernia changes color. These symptoms may be an emergency. Get help right away. Call 911. Do not wait to see if the symptoms go away. Do not drive yourself to the hospital. This information is not intended to replace advice given to you by your health care provider. Make sure you discuss any questions you have with your health care provider.

## 2023-06-18 NOTE — Progress Notes (Unsigned)
 06/18/2023  Reason for Visit:  Umbilical hernia  Requesting Provider:  Lyle San, MD  History of Present Illness: Eric Blevins is a 58 y.o. male presenting for evaluation of an umbilical hernia.  The patient reports having this hernia for a few years, maybe 3-4 years.  He had not had any issues with the hernia until about 1 week ago, when he had an episode of pain with additional bulging of the umbilical hernia.  The pain lasted the day but was not severe, and had fully resolved the next day.  Since then he has not had other issues either.  He reports his hernia usually bulges to a degree and when he lies down it flattens.  Has not had any issues with nausea or vomiting or changes in his bowel function.  He had previously seen Dr. Dortha Gauss in 2024 and at the time, weight loss was recommended.  He has been losing some weight and is down about 12 lbs in the last 3-4 months.  Unfortunately his insurance does not cover Ozempic or similar medications.  He saw his PCP on 05/09/23 and was referred to us  for further evaluation.  Past Medical History: Past Medical History:  Diagnosis Date   Allergy    Benign hypertensive renal disease    Chronic kidney disease, stage I    ED (erectile dysfunction)    Hyperlipidemia    Hypertension    Hypogonadism in male    Obesity    Sleep apnea    Thrombocytosis      Past Surgical History: Past Surgical History:  Procedure Laterality Date   COLONOSCOPY WITH PROPOFOL  N/A 12/17/2017   Procedure: COLONOSCOPY WITH PROPOFOL ;  Surgeon: Selena Daily, MD;  Location: [facility];  Service: Gastroenterology;  Laterality: N/A;   LUMBAR DISC SURGERY  1990   l4/5     Home Medications: Prior to Admission medications   Medication Sig Start Date End Date Taking? Authorizing Provider  amLODipine  (NORVASC ) 10 MG tablet Take 10 mg by mouth daily. 04/28/21  Yes [provider]  lisinopril  (ZESTRIL ) 5 MG tablet Take 5 mg by mouth daily.  04/28/21  Yes [provider]  loratadine  (CLARITIN ) 10 MG tablet Take 10 mg by mouth daily.   Yes [provider]  rosuvastatin  (CRESTOR ) 10 MG tablet Take 10 mg by mouth daily. 05/04/21  Yes [provider]    Allergies: Allergies  Allergen Reactions   Bee Pollen Anaphylaxis    Bee stings    Social History:  reports that he has never smoked. He has never used smokeless tobacco. He reports that he does not drink alcohol and does not use drugs.   Family History: Family History  Problem Relation Age of Onset   Hypertension Father    Hyperlipidemia Father    Diabetes Mother    Cancer Maternal Grandmother    Cancer Maternal Grandfather    Diabetes Paternal Grandmother    Diabetes Brother    Thyroid disease Brother     Review of Systems: Review of Systems  Constitutional:  Negative for chills and fever.  Respiratory:  Negative for shortness of breath.   Cardiovascular:  Negative for chest pain.  Gastrointestinal:  Positive for abdominal pain. Negative for nausea and vomiting.  Genitourinary:  Negative for dysuria.    Physical Exam BP 122/74   Pulse 94   Temp 98.3 F (36.8 C) (Oral)   Ht 5\' 8"  (1.727 m)   Wt 271 lb (122.9 kg)  SpO2 93%   BMI 41.21 kg/m  CONSTITUTIONAL: No acute distress HEENT:  Normocephalic, atraumatic, extraocular motion intact. NECK: Trachea is midline, and there is no jugular venous distension.  RESPIRATORY:  Lungs are clear, and breath sounds are equal bilaterally. Normal respiratory effort without pathologic use of accessory muscles. CARDIOVASCULAR: Heart is regular without murmurs, gallops, or rubs. GI: The abdomen is soft, obese, non-distended, with minimal soreness to palpation at the umbilicus. He has a reducible umbilical hernia measuring about 2 cm in size, reducible after some effort.  No overyling skin changes.  He also has a diastasis recti of about 4 cm separation.   MUSCULOSKELETAL:  Normal muscle strength and  tone in all four extremities.  No peripheral edema or cyanosis. SKIN: Skin turgor is normal. There are no pathologic skin lesions.  NEUROLOGIC:  Motor and sensation is grossly normal.  Cranial nerves are grossly intact. PSYCH:  Alert and oriented to person, place and time. Affect is normal.  Laboratory Analysis: Labs from 10/31/22: Na 139, K 3.9, Cl 103, CO2 27.8, BUN 15, Cr. 1.  LFTs within normal.  WBC 7.6, Hgb 15.4, Hct 47.5, Plt 352  Imaging: No results found.  Assessment and Plan: This is a 58 y.o. male with a reducible umbilical hernia and diastasis recti.  --Discussed with the patient the findings on exam showing a reducible 2 cm umbilical hernia and diastasis recti.  Discussed with him the rationale for weight loss to reduce perioperative complications and increase the likelihood of good hernia outcome with decrease recurrence risk.  Currently he is overall minimally asymptomatic with one episode of pain which resolved on its own.  I think for now it is appropriate to try to wait as he's losing more weight. --He may be interested in bariatric surgery for weight loss.  Discussed with him that if he does have bariatric surgery, it could be that his hernia gets repaired primarily at the same time.  Will send referral to [facility]. --Follow up with me in 3 months so we can re-evaluate his weight loss and any other symptoms.  He knows to call us  sooner if any progressing issues. --All of his questions have been answered.  I spent 30 minutes dedicated to the care of this patient on the date of this encounter to include pre-visit review of records, face-to-face time with the patient discussing diagnosis and management, and any post-visit coordination of care.   Marene Shape, MD [facility]
